# Patient Record
Sex: Female | Born: 1945 | Race: White | Hispanic: No | Smoking: Current every day smoker
Health system: Southern US, Community
[De-identification: ages and names within clinical notes are randomized; demographics above are authoritative.]

## PROBLEM LIST (undated history)

## (undated) DIAGNOSIS — M5430 Sciatica, unspecified side: Secondary | ICD-10-CM

## (undated) DIAGNOSIS — E1122 Type 2 diabetes mellitus with diabetic chronic kidney disease: Secondary | ICD-10-CM

## (undated) DIAGNOSIS — E119 Type 2 diabetes mellitus without complications: Secondary | ICD-10-CM

## (undated) DIAGNOSIS — E7800 Pure hypercholesterolemia, unspecified: Secondary | ICD-10-CM

## (undated) DIAGNOSIS — K5790 Diverticulosis of intestine, part unspecified, without perforation or abscess without bleeding: Secondary | ICD-10-CM

## (undated) DIAGNOSIS — I10 Essential (primary) hypertension: Secondary | ICD-10-CM

## (undated) DIAGNOSIS — I110 Hypertensive heart disease with heart failure: Secondary | ICD-10-CM

## (undated) DIAGNOSIS — N183 Chronic kidney disease, stage 3 unspecified: Secondary | ICD-10-CM

## (undated) HISTORY — DX: Essential (primary) hypertension: I10

## (undated) HISTORY — DX: Sciatica, unspecified side: M54.30

## (undated) HISTORY — DX: Type 2 diabetes mellitus with diabetic chronic kidney disease: E11.22

## (undated) HISTORY — PX: REPLACEMENT TOTAL KNEE: SUR1224

## (undated) HISTORY — DX: Hypertensive heart disease with heart failure: I11.0

## (undated) HISTORY — DX: Type 2 diabetes mellitus without complications: E11.9

## (undated) HISTORY — DX: Chronic kidney disease, stage 3 unspecified: N18.30

## (undated) HISTORY — DX: Diverticulosis of intestine, part unspecified, without perforation or abscess without bleeding: K57.90

## (undated) HISTORY — DX: Pure hypercholesterolemia, unspecified: E78.00

---

## 2004-02-15 ENCOUNTER — Encounter: Admission: RE | Admit: 2004-02-15 | Discharge: 2004-02-15 | Payer: Self-pay | Admitting: Orthopaedic Surgery

## 2004-02-16 ENCOUNTER — Ambulatory Visit (HOSPITAL_COMMUNITY): Admission: RE | Admit: 2004-02-16 | Discharge: 2004-02-16 | Payer: Self-pay | Admitting: Orthopaedic Surgery

## 2004-02-16 ENCOUNTER — Ambulatory Visit (HOSPITAL_BASED_OUTPATIENT_CLINIC_OR_DEPARTMENT_OTHER): Admission: RE | Admit: 2004-02-16 | Discharge: 2004-02-16 | Payer: Self-pay | Admitting: Orthopaedic Surgery

## 2004-02-23 ENCOUNTER — Ambulatory Visit (HOSPITAL_COMMUNITY): Admission: RE | Admit: 2004-02-23 | Discharge: 2004-02-23 | Payer: Self-pay | Admitting: Orthopaedic Surgery

## 2004-02-28 ENCOUNTER — Ambulatory Visit (HOSPITAL_COMMUNITY): Admission: RE | Admit: 2004-02-28 | Discharge: 2004-02-28 | Payer: Self-pay | Admitting: Orthopaedic Surgery

## 2005-07-22 ENCOUNTER — Inpatient Hospital Stay (HOSPITAL_COMMUNITY): Admission: RE | Admit: 2005-07-22 | Discharge: 2005-07-24 | Payer: Self-pay | Admitting: Orthopedic Surgery

## 2006-05-27 IMAGING — CR DG CHEST 2V
2 series · 2 of 2 positions shown · non-contrast
Comparison: 02/15/04.

CLINICAL DATA: Pre-admit for total knee arthroplasty. 
 CHEST ? 2 VIEW:

[view not recorded (1 of 2)]
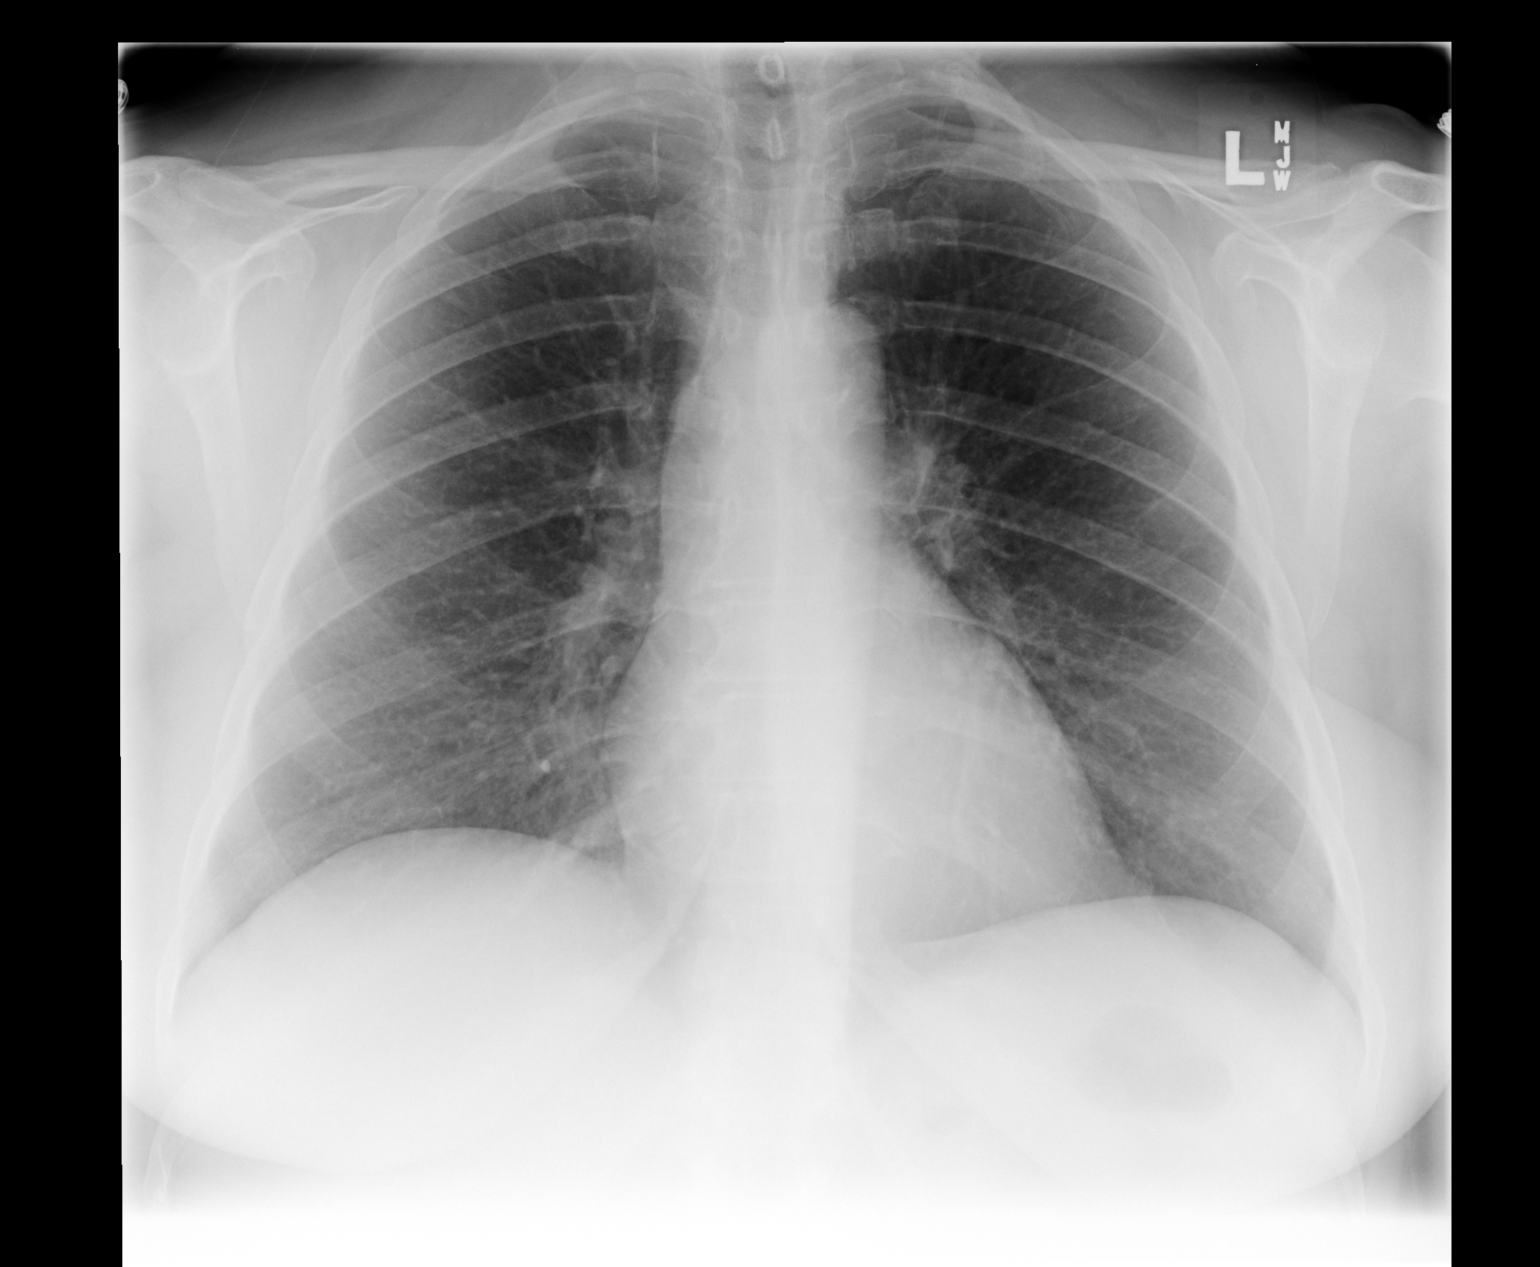

[view not recorded (2 of 2)]
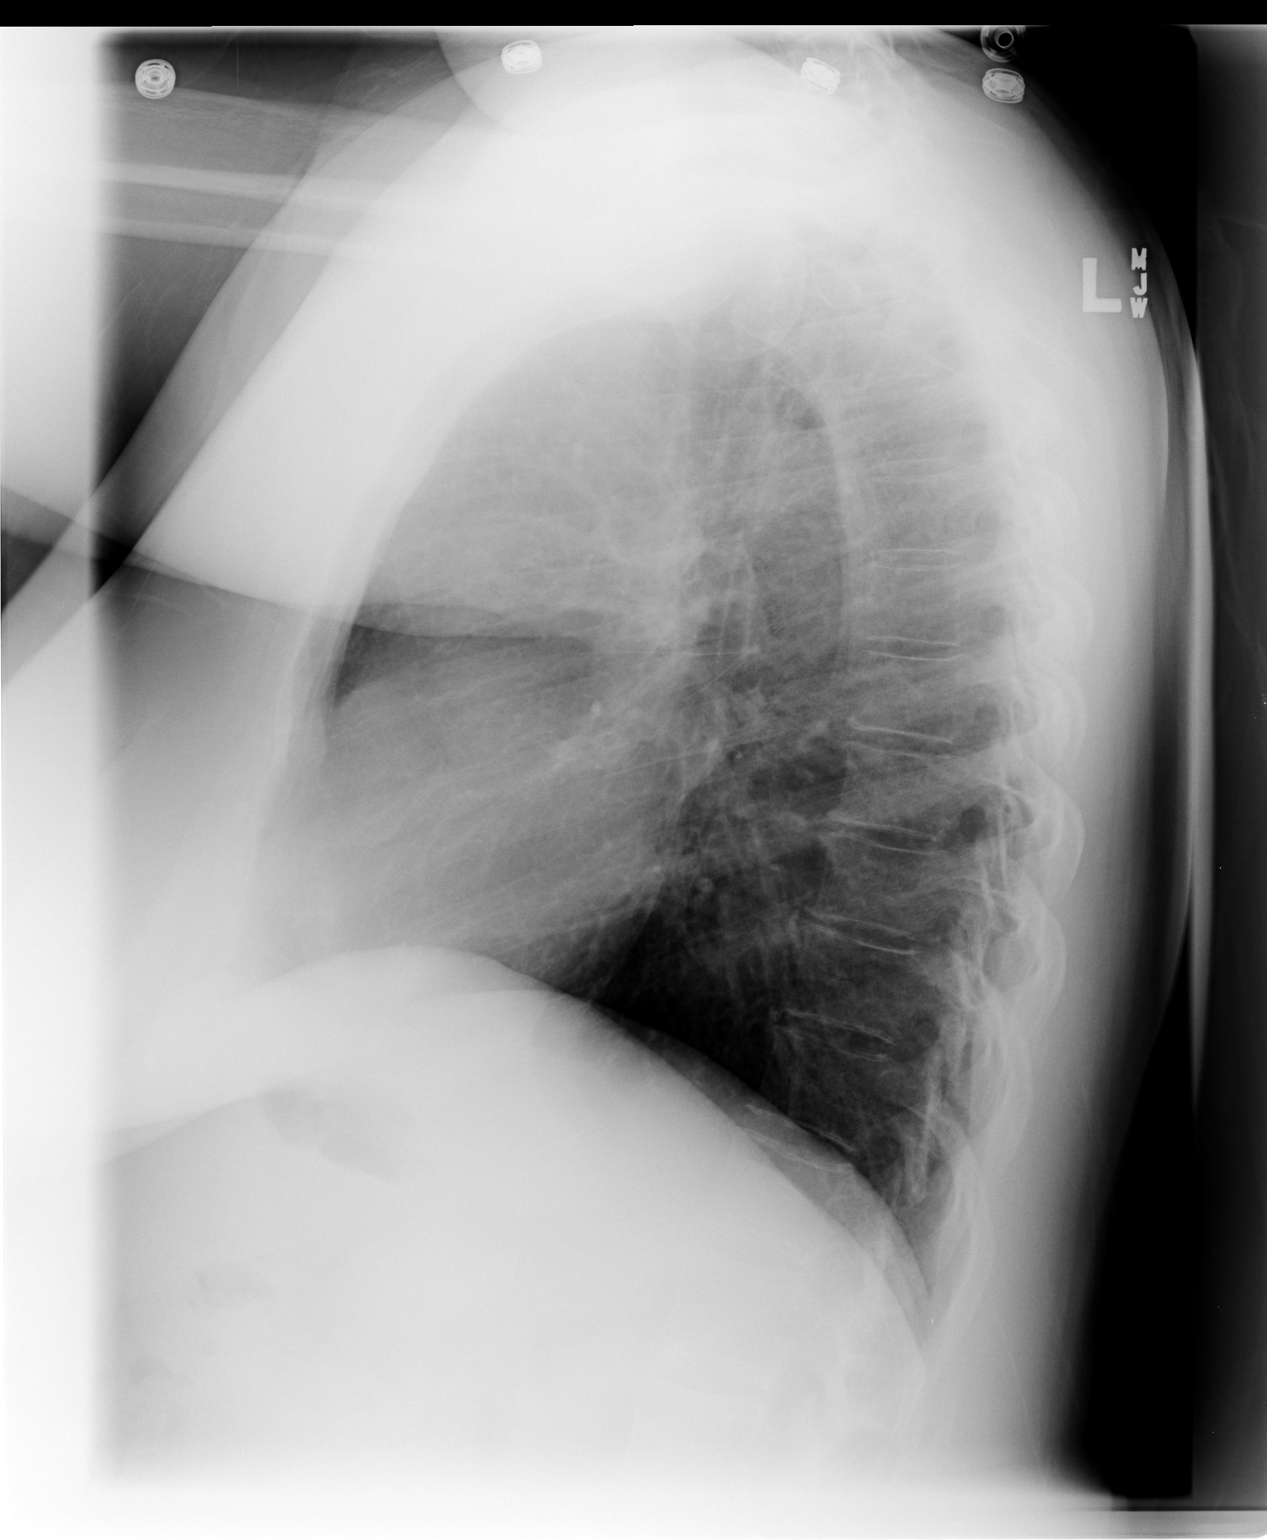

[2 of 2 positions shown; findings below may reference images not displayed]

FINDINGS: Midline trachea.  Heart size normal.  Mediastinal contours unremarkable.  Lungs clear.  No pleural effusion.  Osseous structures intact.
IMPRESSION: No acute cardiopulmonary disease.

## 2014-12-25 DIAGNOSIS — E119 Type 2 diabetes mellitus without complications: Secondary | ICD-10-CM | POA: Diagnosis not present

## 2014-12-25 DIAGNOSIS — I10 Essential (primary) hypertension: Secondary | ICD-10-CM | POA: Diagnosis not present

## 2014-12-25 DIAGNOSIS — E780 Pure hypercholesterolemia: Secondary | ICD-10-CM | POA: Diagnosis not present

## 2014-12-25 DIAGNOSIS — M545 Low back pain: Secondary | ICD-10-CM | POA: Diagnosis not present

## 2015-01-24 DIAGNOSIS — E119 Type 2 diabetes mellitus without complications: Secondary | ICD-10-CM | POA: Diagnosis not present

## 2015-01-24 DIAGNOSIS — M545 Low back pain: Secondary | ICD-10-CM | POA: Diagnosis not present

## 2015-01-24 DIAGNOSIS — F17200 Nicotine dependence, unspecified, uncomplicated: Secondary | ICD-10-CM | POA: Diagnosis not present

## 2015-01-24 DIAGNOSIS — E780 Pure hypercholesterolemia: Secondary | ICD-10-CM | POA: Diagnosis not present

## 2015-02-24 DIAGNOSIS — E119 Type 2 diabetes mellitus without complications: Secondary | ICD-10-CM | POA: Diagnosis not present

## 2015-02-24 DIAGNOSIS — F17200 Nicotine dependence, unspecified, uncomplicated: Secondary | ICD-10-CM | POA: Diagnosis not present

## 2015-02-24 DIAGNOSIS — E785 Hyperlipidemia, unspecified: Secondary | ICD-10-CM | POA: Diagnosis not present

## 2015-03-19 DIAGNOSIS — H35371 Puckering of macula, right eye: Secondary | ICD-10-CM | POA: Diagnosis not present

## 2015-03-19 DIAGNOSIS — H40013 Open angle with borderline findings, low risk, bilateral: Secondary | ICD-10-CM | POA: Diagnosis not present

## 2015-03-30 DIAGNOSIS — M539 Dorsopathy, unspecified: Secondary | ICD-10-CM | POA: Diagnosis not present

## 2015-03-30 DIAGNOSIS — Z23 Encounter for immunization: Secondary | ICD-10-CM | POA: Diagnosis not present

## 2015-04-20 DIAGNOSIS — R109 Unspecified abdominal pain: Secondary | ICD-10-CM | POA: Diagnosis not present

## 2015-04-24 DIAGNOSIS — R1901 Right upper quadrant abdominal swelling, mass and lump: Secondary | ICD-10-CM | POA: Diagnosis not present

## 2015-04-24 DIAGNOSIS — R109 Unspecified abdominal pain: Secondary | ICD-10-CM | POA: Diagnosis not present

## 2015-04-24 DIAGNOSIS — R1011 Right upper quadrant pain: Secondary | ICD-10-CM | POA: Diagnosis not present

## 2015-04-26 DIAGNOSIS — B0223 Postherpetic polyneuropathy: Secondary | ICD-10-CM | POA: Diagnosis not present

## 2015-04-30 DIAGNOSIS — K7689 Other specified diseases of liver: Secondary | ICD-10-CM | POA: Diagnosis not present

## 2015-04-30 DIAGNOSIS — K769 Liver disease, unspecified: Secondary | ICD-10-CM | POA: Diagnosis not present

## 2015-04-30 DIAGNOSIS — R1011 Right upper quadrant pain: Secondary | ICD-10-CM | POA: Diagnosis not present

## 2015-05-03 DIAGNOSIS — Z79899 Other long term (current) drug therapy: Secondary | ICD-10-CM | POA: Diagnosis not present

## 2015-05-21 DIAGNOSIS — E782 Mixed hyperlipidemia: Secondary | ICD-10-CM | POA: Diagnosis not present

## 2015-05-21 DIAGNOSIS — E1165 Type 2 diabetes mellitus with hyperglycemia: Secondary | ICD-10-CM | POA: Diagnosis not present

## 2015-05-22 DIAGNOSIS — E669 Obesity, unspecified: Secondary | ICD-10-CM | POA: Diagnosis not present

## 2015-05-22 DIAGNOSIS — E279 Disorder of adrenal gland, unspecified: Secondary | ICD-10-CM | POA: Diagnosis not present

## 2015-05-22 DIAGNOSIS — Z6830 Body mass index (BMI) 30.0-30.9, adult: Secondary | ICD-10-CM | POA: Diagnosis not present

## 2015-05-28 DIAGNOSIS — E279 Disorder of adrenal gland, unspecified: Secondary | ICD-10-CM | POA: Diagnosis not present

## 2015-05-30 DIAGNOSIS — D1779 Benign lipomatous neoplasm of other sites: Secondary | ICD-10-CM | POA: Diagnosis not present

## 2015-05-30 DIAGNOSIS — E1165 Type 2 diabetes mellitus with hyperglycemia: Secondary | ICD-10-CM | POA: Diagnosis not present

## 2015-05-31 DIAGNOSIS — E279 Disorder of adrenal gland, unspecified: Secondary | ICD-10-CM | POA: Diagnosis not present

## 2015-08-10 DIAGNOSIS — D1779 Benign lipomatous neoplasm of other sites: Secondary | ICD-10-CM | POA: Diagnosis not present

## 2015-08-10 DIAGNOSIS — Z0000 Encounter for general adult medical examination without abnormal findings: Secondary | ICD-10-CM | POA: Diagnosis not present

## 2015-08-10 DIAGNOSIS — E1165 Type 2 diabetes mellitus with hyperglycemia: Secondary | ICD-10-CM | POA: Diagnosis not present

## 2015-08-10 DIAGNOSIS — N8111 Cystocele, midline: Secondary | ICD-10-CM | POA: Diagnosis not present

## 2015-08-16 DIAGNOSIS — Z1211 Encounter for screening for malignant neoplasm of colon: Secondary | ICD-10-CM | POA: Diagnosis not present

## 2015-08-31 DIAGNOSIS — Z1231 Encounter for screening mammogram for malignant neoplasm of breast: Secondary | ICD-10-CM | POA: Diagnosis not present

## 2015-09-10 DIAGNOSIS — E278 Other specified disorders of adrenal gland: Secondary | ICD-10-CM | POA: Insufficient documentation

## 2015-09-10 DIAGNOSIS — E2740 Unspecified adrenocortical insufficiency: Secondary | ICD-10-CM | POA: Diagnosis not present

## 2015-09-10 DIAGNOSIS — E785 Hyperlipidemia, unspecified: Secondary | ICD-10-CM | POA: Diagnosis not present

## 2015-09-10 DIAGNOSIS — E109 Type 1 diabetes mellitus without complications: Secondary | ICD-10-CM | POA: Diagnosis not present

## 2015-09-10 DIAGNOSIS — E279 Disorder of adrenal gland, unspecified: Secondary | ICD-10-CM | POA: Diagnosis not present

## 2015-09-10 DIAGNOSIS — Z7982 Long term (current) use of aspirin: Secondary | ICD-10-CM | POA: Diagnosis not present

## 2015-09-10 DIAGNOSIS — Z79899 Other long term (current) drug therapy: Secondary | ICD-10-CM | POA: Diagnosis not present

## 2015-09-10 DIAGNOSIS — I10 Essential (primary) hypertension: Secondary | ICD-10-CM | POA: Diagnosis not present

## 2015-09-10 DIAGNOSIS — Z7984 Long term (current) use of oral hypoglycemic drugs: Secondary | ICD-10-CM | POA: Diagnosis not present

## 2015-09-10 HISTORY — DX: Other specified disorders of adrenal gland: E27.8

## 2015-09-12 DIAGNOSIS — E279 Disorder of adrenal gland, unspecified: Secondary | ICD-10-CM | POA: Diagnosis not present

## 2015-09-17 DIAGNOSIS — R7989 Other specified abnormal findings of blood chemistry: Secondary | ICD-10-CM | POA: Insufficient documentation

## 2015-09-17 HISTORY — DX: Other specified abnormal findings of blood chemistry: R79.89

## 2015-09-27 DIAGNOSIS — Z79899 Other long term (current) drug therapy: Secondary | ICD-10-CM | POA: Diagnosis not present

## 2015-09-27 DIAGNOSIS — Z7982 Long term (current) use of aspirin: Secondary | ICD-10-CM | POA: Diagnosis not present

## 2015-09-27 DIAGNOSIS — E279 Disorder of adrenal gland, unspecified: Secondary | ICD-10-CM | POA: Diagnosis not present

## 2015-09-27 DIAGNOSIS — Z7984 Long term (current) use of oral hypoglycemic drugs: Secondary | ICD-10-CM | POA: Diagnosis not present

## 2015-09-27 DIAGNOSIS — E109 Type 1 diabetes mellitus without complications: Secondary | ICD-10-CM | POA: Diagnosis not present

## 2015-09-27 DIAGNOSIS — E785 Hyperlipidemia, unspecified: Secondary | ICD-10-CM | POA: Diagnosis not present

## 2015-09-27 DIAGNOSIS — I10 Essential (primary) hypertension: Secondary | ICD-10-CM | POA: Diagnosis not present

## 2015-10-24 DIAGNOSIS — I10 Essential (primary) hypertension: Secondary | ICD-10-CM | POA: Diagnosis not present

## 2015-10-24 DIAGNOSIS — R9431 Abnormal electrocardiogram [ECG] [EKG]: Secondary | ICD-10-CM | POA: Diagnosis not present

## 2015-10-24 DIAGNOSIS — Z01810 Encounter for preprocedural cardiovascular examination: Secondary | ICD-10-CM | POA: Diagnosis not present

## 2015-10-24 DIAGNOSIS — Z01812 Encounter for preprocedural laboratory examination: Secondary | ICD-10-CM | POA: Diagnosis not present

## 2015-10-24 DIAGNOSIS — E279 Disorder of adrenal gland, unspecified: Secondary | ICD-10-CM | POA: Diagnosis not present

## 2015-10-26 DIAGNOSIS — I10 Essential (primary) hypertension: Secondary | ICD-10-CM | POA: Diagnosis not present

## 2015-10-31 DIAGNOSIS — Z7982 Long term (current) use of aspirin: Secondary | ICD-10-CM | POA: Diagnosis not present

## 2015-10-31 DIAGNOSIS — Z79899 Other long term (current) drug therapy: Secondary | ICD-10-CM | POA: Diagnosis not present

## 2015-10-31 DIAGNOSIS — E279 Disorder of adrenal gland, unspecified: Secondary | ICD-10-CM | POA: Diagnosis not present

## 2015-10-31 DIAGNOSIS — Z7951 Long term (current) use of inhaled steroids: Secondary | ICD-10-CM | POA: Diagnosis not present

## 2015-10-31 DIAGNOSIS — R42 Dizziness and giddiness: Secondary | ICD-10-CM | POA: Diagnosis not present

## 2015-10-31 DIAGNOSIS — Z7984 Long term (current) use of oral hypoglycemic drugs: Secondary | ICD-10-CM | POA: Diagnosis not present

## 2015-10-31 DIAGNOSIS — E119 Type 2 diabetes mellitus without complications: Secondary | ICD-10-CM | POA: Diagnosis not present

## 2015-10-31 DIAGNOSIS — E785 Hyperlipidemia, unspecified: Secondary | ICD-10-CM | POA: Diagnosis not present

## 2015-10-31 DIAGNOSIS — I10 Essential (primary) hypertension: Secondary | ICD-10-CM | POA: Diagnosis not present

## 2015-10-31 DIAGNOSIS — J302 Other seasonal allergic rhinitis: Secondary | ICD-10-CM | POA: Diagnosis not present

## 2015-10-31 DIAGNOSIS — D3500 Benign neoplasm of unspecified adrenal gland: Secondary | ICD-10-CM | POA: Diagnosis not present

## 2015-10-31 DIAGNOSIS — E278 Other specified disorders of adrenal gland: Secondary | ICD-10-CM | POA: Diagnosis not present

## 2015-10-31 DIAGNOSIS — J449 Chronic obstructive pulmonary disease, unspecified: Secondary | ICD-10-CM | POA: Diagnosis not present

## 2015-10-31 DIAGNOSIS — G4733 Obstructive sleep apnea (adult) (pediatric): Secondary | ICD-10-CM | POA: Diagnosis not present

## 2015-10-31 DIAGNOSIS — D1779 Benign lipomatous neoplasm of other sites: Secondary | ICD-10-CM | POA: Diagnosis not present

## 2015-11-22 DIAGNOSIS — E896 Postprocedural adrenocortical (-medullary) hypofunction: Secondary | ICD-10-CM | POA: Diagnosis not present

## 2015-11-22 DIAGNOSIS — Z483 Aftercare following surgery for neoplasm: Secondary | ICD-10-CM | POA: Diagnosis not present

## 2015-11-22 DIAGNOSIS — D1779 Benign lipomatous neoplasm of other sites: Secondary | ICD-10-CM | POA: Diagnosis not present

## 2015-12-11 DIAGNOSIS — E1165 Type 2 diabetes mellitus with hyperglycemia: Secondary | ICD-10-CM | POA: Diagnosis not present

## 2015-12-20 DIAGNOSIS — Z720 Tobacco use: Secondary | ICD-10-CM | POA: Diagnosis not present

## 2015-12-20 DIAGNOSIS — J449 Chronic obstructive pulmonary disease, unspecified: Secondary | ICD-10-CM | POA: Diagnosis not present

## 2015-12-20 DIAGNOSIS — E1165 Type 2 diabetes mellitus with hyperglycemia: Secondary | ICD-10-CM | POA: Diagnosis not present

## 2015-12-20 DIAGNOSIS — Z0000 Encounter for general adult medical examination without abnormal findings: Secondary | ICD-10-CM | POA: Diagnosis not present

## 2016-03-27 DIAGNOSIS — Z23 Encounter for immunization: Secondary | ICD-10-CM | POA: Diagnosis not present

## 2016-04-23 DIAGNOSIS — R05 Cough: Secondary | ICD-10-CM | POA: Diagnosis not present

## 2016-04-23 DIAGNOSIS — J189 Pneumonia, unspecified organism: Secondary | ICD-10-CM | POA: Diagnosis not present

## 2016-04-23 DIAGNOSIS — R062 Wheezing: Secondary | ICD-10-CM | POA: Diagnosis not present

## 2016-05-24 DIAGNOSIS — J189 Pneumonia, unspecified organism: Secondary | ICD-10-CM | POA: Diagnosis not present

## 2016-06-13 DIAGNOSIS — E1165 Type 2 diabetes mellitus with hyperglycemia: Secondary | ICD-10-CM | POA: Diagnosis not present

## 2016-06-20 DIAGNOSIS — Z9181 History of falling: Secondary | ICD-10-CM | POA: Diagnosis not present

## 2016-06-20 DIAGNOSIS — Z1389 Encounter for screening for other disorder: Secondary | ICD-10-CM | POA: Diagnosis not present

## 2016-06-20 DIAGNOSIS — E1165 Type 2 diabetes mellitus with hyperglycemia: Secondary | ICD-10-CM | POA: Diagnosis not present

## 2016-06-20 DIAGNOSIS — J42 Unspecified chronic bronchitis: Secondary | ICD-10-CM | POA: Diagnosis not present

## 2016-06-23 DIAGNOSIS — J189 Pneumonia, unspecified organism: Secondary | ICD-10-CM | POA: Diagnosis not present

## 2016-07-24 DIAGNOSIS — L089 Local infection of the skin and subcutaneous tissue, unspecified: Secondary | ICD-10-CM | POA: Diagnosis not present

## 2016-07-24 DIAGNOSIS — L723 Sebaceous cyst: Secondary | ICD-10-CM | POA: Diagnosis not present

## 2016-07-24 DIAGNOSIS — J189 Pneumonia, unspecified organism: Secondary | ICD-10-CM | POA: Diagnosis not present

## 2016-07-25 DIAGNOSIS — L723 Sebaceous cyst: Secondary | ICD-10-CM | POA: Diagnosis not present

## 2016-08-18 DIAGNOSIS — L723 Sebaceous cyst: Secondary | ICD-10-CM | POA: Diagnosis not present

## 2016-08-18 DIAGNOSIS — L089 Local infection of the skin and subcutaneous tissue, unspecified: Secondary | ICD-10-CM | POA: Diagnosis not present

## 2016-08-24 DIAGNOSIS — J189 Pneumonia, unspecified organism: Secondary | ICD-10-CM | POA: Diagnosis not present

## 2016-09-21 DIAGNOSIS — J189 Pneumonia, unspecified organism: Secondary | ICD-10-CM | POA: Diagnosis not present

## 2016-10-21 DIAGNOSIS — Z1231 Encounter for screening mammogram for malignant neoplasm of breast: Secondary | ICD-10-CM | POA: Diagnosis not present

## 2016-10-22 DIAGNOSIS — J189 Pneumonia, unspecified organism: Secondary | ICD-10-CM | POA: Diagnosis not present

## 2016-11-20 DIAGNOSIS — J189 Pneumonia, unspecified organism: Secondary | ICD-10-CM | POA: Diagnosis not present

## 2016-11-21 DIAGNOSIS — J189 Pneumonia, unspecified organism: Secondary | ICD-10-CM | POA: Diagnosis not present

## 2016-12-08 DIAGNOSIS — E1165 Type 2 diabetes mellitus with hyperglycemia: Secondary | ICD-10-CM | POA: Diagnosis not present

## 2016-12-22 DIAGNOSIS — J189 Pneumonia, unspecified organism: Secondary | ICD-10-CM | POA: Diagnosis not present

## 2016-12-24 DIAGNOSIS — R7989 Other specified abnormal findings of blood chemistry: Secondary | ICD-10-CM | POA: Diagnosis not present

## 2016-12-24 DIAGNOSIS — Z0000 Encounter for general adult medical examination without abnormal findings: Secondary | ICD-10-CM | POA: Diagnosis not present

## 2016-12-24 DIAGNOSIS — E1165 Type 2 diabetes mellitus with hyperglycemia: Secondary | ICD-10-CM | POA: Diagnosis not present

## 2017-01-21 DIAGNOSIS — J189 Pneumonia, unspecified organism: Secondary | ICD-10-CM | POA: Diagnosis not present

## 2017-02-21 DIAGNOSIS — J189 Pneumonia, unspecified organism: Secondary | ICD-10-CM | POA: Diagnosis not present

## 2017-02-23 DIAGNOSIS — E1165 Type 2 diabetes mellitus with hyperglycemia: Secondary | ICD-10-CM | POA: Diagnosis not present

## 2017-03-04 DIAGNOSIS — Z23 Encounter for immunization: Secondary | ICD-10-CM | POA: Diagnosis not present

## 2017-03-24 DIAGNOSIS — J189 Pneumonia, unspecified organism: Secondary | ICD-10-CM | POA: Diagnosis not present

## 2017-04-07 DIAGNOSIS — H40013 Open angle with borderline findings, low risk, bilateral: Secondary | ICD-10-CM | POA: Diagnosis not present

## 2017-04-07 DIAGNOSIS — E119 Type 2 diabetes mellitus without complications: Secondary | ICD-10-CM | POA: Diagnosis not present

## 2017-04-23 DIAGNOSIS — J189 Pneumonia, unspecified organism: Secondary | ICD-10-CM | POA: Diagnosis not present

## 2017-06-17 DIAGNOSIS — E1165 Type 2 diabetes mellitus with hyperglycemia: Secondary | ICD-10-CM | POA: Diagnosis not present

## 2017-06-25 DIAGNOSIS — Z1331 Encounter for screening for depression: Secondary | ICD-10-CM | POA: Diagnosis not present

## 2017-06-25 DIAGNOSIS — E1165 Type 2 diabetes mellitus with hyperglycemia: Secondary | ICD-10-CM | POA: Diagnosis not present

## 2017-06-25 DIAGNOSIS — Z9181 History of falling: Secondary | ICD-10-CM | POA: Diagnosis not present

## 2017-08-04 DIAGNOSIS — I10 Essential (primary) hypertension: Secondary | ICD-10-CM | POA: Diagnosis not present

## 2017-08-04 DIAGNOSIS — H47233 Glaucomatous optic atrophy, bilateral: Secondary | ICD-10-CM | POA: Diagnosis not present

## 2017-08-04 DIAGNOSIS — H401131 Primary open-angle glaucoma, bilateral, mild stage: Secondary | ICD-10-CM | POA: Diagnosis not present

## 2017-09-02 DIAGNOSIS — H401131 Primary open-angle glaucoma, bilateral, mild stage: Secondary | ICD-10-CM | POA: Diagnosis not present

## 2017-09-02 DIAGNOSIS — H47233 Glaucomatous optic atrophy, bilateral: Secondary | ICD-10-CM | POA: Diagnosis not present

## 2017-12-22 DIAGNOSIS — E1165 Type 2 diabetes mellitus with hyperglycemia: Secondary | ICD-10-CM | POA: Diagnosis not present

## 2017-12-22 DIAGNOSIS — Z1231 Encounter for screening mammogram for malignant neoplasm of breast: Secondary | ICD-10-CM | POA: Diagnosis not present

## 2017-12-22 DIAGNOSIS — Z0000 Encounter for general adult medical examination without abnormal findings: Secondary | ICD-10-CM | POA: Diagnosis not present

## 2017-12-28 DIAGNOSIS — H409 Unspecified glaucoma: Secondary | ICD-10-CM | POA: Diagnosis not present

## 2017-12-28 DIAGNOSIS — Z0000 Encounter for general adult medical examination without abnormal findings: Secondary | ICD-10-CM | POA: Diagnosis not present

## 2017-12-28 DIAGNOSIS — R209 Unspecified disturbances of skin sensation: Secondary | ICD-10-CM | POA: Diagnosis not present

## 2017-12-28 DIAGNOSIS — E1165 Type 2 diabetes mellitus with hyperglycemia: Secondary | ICD-10-CM | POA: Diagnosis not present

## 2017-12-28 DIAGNOSIS — Z1339 Encounter for screening examination for other mental health and behavioral disorders: Secondary | ICD-10-CM | POA: Diagnosis not present

## 2018-01-14 DIAGNOSIS — H401131 Primary open-angle glaucoma, bilateral, mild stage: Secondary | ICD-10-CM | POA: Diagnosis not present

## 2018-01-14 DIAGNOSIS — H47233 Glaucomatous optic atrophy, bilateral: Secondary | ICD-10-CM | POA: Diagnosis not present

## 2018-04-14 DIAGNOSIS — Z6828 Body mass index (BMI) 28.0-28.9, adult: Secondary | ICD-10-CM | POA: Diagnosis not present

## 2018-04-14 DIAGNOSIS — R1110 Vomiting, unspecified: Secondary | ICD-10-CM | POA: Diagnosis not present

## 2018-04-14 DIAGNOSIS — J309 Allergic rhinitis, unspecified: Secondary | ICD-10-CM | POA: Diagnosis not present

## 2018-04-14 DIAGNOSIS — J0100 Acute maxillary sinusitis, unspecified: Secondary | ICD-10-CM | POA: Diagnosis not present

## 2018-05-10 DIAGNOSIS — Z23 Encounter for immunization: Secondary | ICD-10-CM | POA: Diagnosis not present

## 2018-05-25 DIAGNOSIS — E1165 Type 2 diabetes mellitus with hyperglycemia: Secondary | ICD-10-CM | POA: Diagnosis not present

## 2018-05-31 DIAGNOSIS — Z6828 Body mass index (BMI) 28.0-28.9, adult: Secondary | ICD-10-CM | POA: Diagnosis not present

## 2018-05-31 DIAGNOSIS — I10 Essential (primary) hypertension: Secondary | ICD-10-CM | POA: Diagnosis not present

## 2018-05-31 DIAGNOSIS — E1165 Type 2 diabetes mellitus with hyperglycemia: Secondary | ICD-10-CM | POA: Diagnosis not present

## 2018-07-08 DIAGNOSIS — R0982 Postnasal drip: Secondary | ICD-10-CM | POA: Diagnosis not present

## 2018-07-08 DIAGNOSIS — J449 Chronic obstructive pulmonary disease, unspecified: Secondary | ICD-10-CM | POA: Diagnosis not present

## 2018-07-08 DIAGNOSIS — Z0000 Encounter for general adult medical examination without abnormal findings: Secondary | ICD-10-CM | POA: Diagnosis not present

## 2018-07-08 DIAGNOSIS — I10 Essential (primary) hypertension: Secondary | ICD-10-CM | POA: Diagnosis not present

## 2018-07-08 DIAGNOSIS — E782 Mixed hyperlipidemia: Secondary | ICD-10-CM | POA: Diagnosis not present

## 2018-07-08 DIAGNOSIS — Z720 Tobacco use: Secondary | ICD-10-CM | POA: Diagnosis not present

## 2018-07-08 DIAGNOSIS — E1165 Type 2 diabetes mellitus with hyperglycemia: Secondary | ICD-10-CM | POA: Diagnosis not present

## 2018-07-08 DIAGNOSIS — J309 Allergic rhinitis, unspecified: Secondary | ICD-10-CM | POA: Diagnosis not present

## 2018-07-08 DIAGNOSIS — H409 Unspecified glaucoma: Secondary | ICD-10-CM | POA: Diagnosis not present

## 2018-07-08 DIAGNOSIS — Z1331 Encounter for screening for depression: Secondary | ICD-10-CM | POA: Diagnosis not present

## 2018-08-09 DIAGNOSIS — M1811 Unilateral primary osteoarthritis of first carpometacarpal joint, right hand: Secondary | ICD-10-CM | POA: Diagnosis not present

## 2018-08-09 DIAGNOSIS — S63641S Sprain of metacarpophalangeal joint of right thumb, sequela: Secondary | ICD-10-CM | POA: Diagnosis not present

## 2018-08-09 DIAGNOSIS — Z6829 Body mass index (BMI) 29.0-29.9, adult: Secondary | ICD-10-CM | POA: Diagnosis not present

## 2018-08-16 DIAGNOSIS — M1811 Unilateral primary osteoarthritis of first carpometacarpal joint, right hand: Secondary | ICD-10-CM | POA: Diagnosis not present

## 2018-08-16 DIAGNOSIS — R52 Pain, unspecified: Secondary | ICD-10-CM | POA: Diagnosis not present

## 2018-08-16 DIAGNOSIS — G5601 Carpal tunnel syndrome, right upper limb: Secondary | ICD-10-CM | POA: Diagnosis not present

## 2018-08-20 DIAGNOSIS — M1811 Unilateral primary osteoarthritis of first carpometacarpal joint, right hand: Secondary | ICD-10-CM | POA: Diagnosis not present

## 2018-08-20 DIAGNOSIS — M25641 Stiffness of right hand, not elsewhere classified: Secondary | ICD-10-CM | POA: Diagnosis not present

## 2018-08-20 DIAGNOSIS — M79644 Pain in right finger(s): Secondary | ICD-10-CM | POA: Diagnosis not present

## 2018-10-26 DIAGNOSIS — I10 Essential (primary) hypertension: Secondary | ICD-10-CM | POA: Diagnosis not present

## 2018-10-26 DIAGNOSIS — E1165 Type 2 diabetes mellitus with hyperglycemia: Secondary | ICD-10-CM | POA: Diagnosis not present

## 2018-10-26 DIAGNOSIS — Z6829 Body mass index (BMI) 29.0-29.9, adult: Secondary | ICD-10-CM | POA: Diagnosis not present

## 2018-10-26 DIAGNOSIS — Z79899 Other long term (current) drug therapy: Secondary | ICD-10-CM | POA: Diagnosis not present

## 2018-10-26 DIAGNOSIS — E782 Mixed hyperlipidemia: Secondary | ICD-10-CM | POA: Diagnosis not present

## 2018-10-28 DIAGNOSIS — E1165 Type 2 diabetes mellitus with hyperglycemia: Secondary | ICD-10-CM | POA: Diagnosis not present

## 2018-10-28 DIAGNOSIS — E782 Mixed hyperlipidemia: Secondary | ICD-10-CM | POA: Diagnosis not present

## 2018-11-27 DIAGNOSIS — E1165 Type 2 diabetes mellitus with hyperglycemia: Secondary | ICD-10-CM | POA: Diagnosis not present

## 2018-11-27 DIAGNOSIS — I10 Essential (primary) hypertension: Secondary | ICD-10-CM | POA: Diagnosis not present

## 2018-12-01 DIAGNOSIS — E1165 Type 2 diabetes mellitus with hyperglycemia: Secondary | ICD-10-CM | POA: Diagnosis not present

## 2018-12-01 DIAGNOSIS — I10 Essential (primary) hypertension: Secondary | ICD-10-CM | POA: Diagnosis not present

## 2018-12-01 DIAGNOSIS — R0982 Postnasal drip: Secondary | ICD-10-CM | POA: Diagnosis not present

## 2018-12-01 DIAGNOSIS — J309 Allergic rhinitis, unspecified: Secondary | ICD-10-CM | POA: Diagnosis not present

## 2018-12-01 DIAGNOSIS — Z6829 Body mass index (BMI) 29.0-29.9, adult: Secondary | ICD-10-CM | POA: Diagnosis not present

## 2019-03-08 DIAGNOSIS — E1165 Type 2 diabetes mellitus with hyperglycemia: Secondary | ICD-10-CM | POA: Diagnosis not present

## 2019-03-08 DIAGNOSIS — Z6828 Body mass index (BMI) 28.0-28.9, adult: Secondary | ICD-10-CM | POA: Diagnosis not present

## 2019-03-08 DIAGNOSIS — I10 Essential (primary) hypertension: Secondary | ICD-10-CM | POA: Diagnosis not present

## 2019-03-08 DIAGNOSIS — R0600 Dyspnea, unspecified: Secondary | ICD-10-CM | POA: Diagnosis not present

## 2019-03-08 DIAGNOSIS — Z23 Encounter for immunization: Secondary | ICD-10-CM | POA: Diagnosis not present

## 2019-03-18 DIAGNOSIS — Z1231 Encounter for screening mammogram for malignant neoplasm of breast: Secondary | ICD-10-CM | POA: Diagnosis not present

## 2019-04-07 DIAGNOSIS — I10 Essential (primary) hypertension: Secondary | ICD-10-CM | POA: Diagnosis not present

## 2019-04-07 DIAGNOSIS — E1165 Type 2 diabetes mellitus with hyperglycemia: Secondary | ICD-10-CM | POA: Diagnosis not present

## 2019-04-07 DIAGNOSIS — R0600 Dyspnea, unspecified: Secondary | ICD-10-CM | POA: Diagnosis not present

## 2019-04-07 DIAGNOSIS — E669 Obesity, unspecified: Secondary | ICD-10-CM | POA: Diagnosis not present

## 2019-04-07 DIAGNOSIS — Z6829 Body mass index (BMI) 29.0-29.9, adult: Secondary | ICD-10-CM | POA: Diagnosis not present

## 2019-04-25 DIAGNOSIS — R69 Illness, unspecified: Secondary | ICD-10-CM | POA: Diagnosis not present

## 2019-04-29 DIAGNOSIS — I10 Essential (primary) hypertension: Secondary | ICD-10-CM | POA: Diagnosis not present

## 2019-04-29 DIAGNOSIS — E1165 Type 2 diabetes mellitus with hyperglycemia: Secondary | ICD-10-CM | POA: Diagnosis not present

## 2019-05-30 DIAGNOSIS — E7800 Pure hypercholesterolemia, unspecified: Secondary | ICD-10-CM | POA: Diagnosis not present

## 2019-05-30 DIAGNOSIS — I10 Essential (primary) hypertension: Secondary | ICD-10-CM | POA: Diagnosis not present

## 2019-05-30 DIAGNOSIS — E1165 Type 2 diabetes mellitus with hyperglycemia: Secondary | ICD-10-CM | POA: Diagnosis not present

## 2019-06-12 DIAGNOSIS — R69 Illness, unspecified: Secondary | ICD-10-CM | POA: Diagnosis not present

## 2019-06-15 DIAGNOSIS — R69 Illness, unspecified: Secondary | ICD-10-CM | POA: Diagnosis not present

## 2019-07-11 DIAGNOSIS — E782 Mixed hyperlipidemia: Secondary | ICD-10-CM | POA: Diagnosis not present

## 2019-07-11 DIAGNOSIS — H409 Unspecified glaucoma: Secondary | ICD-10-CM | POA: Diagnosis not present

## 2019-07-11 DIAGNOSIS — J449 Chronic obstructive pulmonary disease, unspecified: Secondary | ICD-10-CM | POA: Diagnosis not present

## 2019-07-11 DIAGNOSIS — Z79899 Other long term (current) drug therapy: Secondary | ICD-10-CM | POA: Diagnosis not present

## 2019-07-11 DIAGNOSIS — K219 Gastro-esophageal reflux disease without esophagitis: Secondary | ICD-10-CM | POA: Diagnosis not present

## 2019-07-11 DIAGNOSIS — Z0000 Encounter for general adult medical examination without abnormal findings: Secondary | ICD-10-CM | POA: Diagnosis not present

## 2019-07-11 DIAGNOSIS — J309 Allergic rhinitis, unspecified: Secondary | ICD-10-CM | POA: Diagnosis not present

## 2019-07-11 DIAGNOSIS — I10 Essential (primary) hypertension: Secondary | ICD-10-CM | POA: Diagnosis not present

## 2019-07-11 DIAGNOSIS — R0982 Postnasal drip: Secondary | ICD-10-CM | POA: Diagnosis not present

## 2019-07-11 DIAGNOSIS — E1165 Type 2 diabetes mellitus with hyperglycemia: Secondary | ICD-10-CM | POA: Diagnosis not present

## 2019-07-19 DIAGNOSIS — Z7984 Long term (current) use of oral hypoglycemic drugs: Secondary | ICD-10-CM | POA: Diagnosis not present

## 2019-07-19 DIAGNOSIS — H5203 Hypermetropia, bilateral: Secondary | ICD-10-CM | POA: Diagnosis not present

## 2019-07-19 DIAGNOSIS — H401131 Primary open-angle glaucoma, bilateral, mild stage: Secondary | ICD-10-CM | POA: Diagnosis not present

## 2019-07-19 DIAGNOSIS — H3530 Unspecified macular degeneration: Secondary | ICD-10-CM | POA: Diagnosis not present

## 2019-07-19 DIAGNOSIS — H25813 Combined forms of age-related cataract, bilateral: Secondary | ICD-10-CM | POA: Diagnosis not present

## 2019-07-19 DIAGNOSIS — H353121 Nonexudative age-related macular degeneration, left eye, early dry stage: Secondary | ICD-10-CM | POA: Diagnosis not present

## 2019-07-19 DIAGNOSIS — H353112 Nonexudative age-related macular degeneration, right eye, intermediate dry stage: Secondary | ICD-10-CM | POA: Diagnosis not present

## 2019-07-19 DIAGNOSIS — H47233 Glaucomatous optic atrophy, bilateral: Secondary | ICD-10-CM | POA: Diagnosis not present

## 2019-07-19 DIAGNOSIS — E119 Type 2 diabetes mellitus without complications: Secondary | ICD-10-CM | POA: Diagnosis not present

## 2019-07-19 DIAGNOSIS — H52223 Regular astigmatism, bilateral: Secondary | ICD-10-CM | POA: Diagnosis not present

## 2019-07-22 DIAGNOSIS — Z0100 Encounter for examination of eyes and vision without abnormal findings: Secondary | ICD-10-CM | POA: Diagnosis not present

## 2019-07-30 DIAGNOSIS — I10 Essential (primary) hypertension: Secondary | ICD-10-CM | POA: Diagnosis not present

## 2019-07-30 DIAGNOSIS — E1165 Type 2 diabetes mellitus with hyperglycemia: Secondary | ICD-10-CM | POA: Diagnosis not present

## 2019-07-30 DIAGNOSIS — J449 Chronic obstructive pulmonary disease, unspecified: Secondary | ICD-10-CM | POA: Diagnosis not present

## 2019-08-11 DIAGNOSIS — Z6828 Body mass index (BMI) 28.0-28.9, adult: Secondary | ICD-10-CM | POA: Diagnosis not present

## 2019-08-11 DIAGNOSIS — Z9181 History of falling: Secondary | ICD-10-CM | POA: Diagnosis not present

## 2019-08-11 DIAGNOSIS — E119 Type 2 diabetes mellitus without complications: Secondary | ICD-10-CM | POA: Diagnosis not present

## 2019-08-11 DIAGNOSIS — Z1331 Encounter for screening for depression: Secondary | ICD-10-CM | POA: Diagnosis not present

## 2019-08-11 DIAGNOSIS — K219 Gastro-esophageal reflux disease without esophagitis: Secondary | ICD-10-CM | POA: Diagnosis not present

## 2019-08-11 DIAGNOSIS — I10 Essential (primary) hypertension: Secondary | ICD-10-CM | POA: Diagnosis not present

## 2019-09-08 DIAGNOSIS — H25813 Combined forms of age-related cataract, bilateral: Secondary | ICD-10-CM | POA: Diagnosis not present

## 2019-09-08 DIAGNOSIS — H353132 Nonexudative age-related macular degeneration, bilateral, intermediate dry stage: Secondary | ICD-10-CM | POA: Diagnosis not present

## 2019-09-15 DIAGNOSIS — M5386 Other specified dorsopathies, lumbar region: Secondary | ICD-10-CM | POA: Diagnosis not present

## 2019-09-23 DIAGNOSIS — Z6827 Body mass index (BMI) 27.0-27.9, adult: Secondary | ICD-10-CM | POA: Diagnosis not present

## 2019-09-23 DIAGNOSIS — M62830 Muscle spasm of back: Secondary | ICD-10-CM | POA: Diagnosis not present

## 2019-09-23 DIAGNOSIS — M48061 Spinal stenosis, lumbar region without neurogenic claudication: Secondary | ICD-10-CM | POA: Diagnosis not present

## 2019-09-23 DIAGNOSIS — M5442 Lumbago with sciatica, left side: Secondary | ICD-10-CM | POA: Diagnosis not present

## 2019-09-28 DIAGNOSIS — I10 Essential (primary) hypertension: Secondary | ICD-10-CM | POA: Diagnosis not present

## 2019-09-28 DIAGNOSIS — E119 Type 2 diabetes mellitus without complications: Secondary | ICD-10-CM | POA: Diagnosis not present

## 2019-10-07 DIAGNOSIS — E1165 Type 2 diabetes mellitus with hyperglycemia: Secondary | ICD-10-CM | POA: Diagnosis not present

## 2019-10-07 DIAGNOSIS — I119 Hypertensive heart disease without heart failure: Secondary | ICD-10-CM | POA: Diagnosis not present

## 2019-10-07 DIAGNOSIS — E669 Obesity, unspecified: Secondary | ICD-10-CM | POA: Diagnosis not present

## 2019-10-07 DIAGNOSIS — M5442 Lumbago with sciatica, left side: Secondary | ICD-10-CM | POA: Diagnosis not present

## 2019-10-07 DIAGNOSIS — Z6828 Body mass index (BMI) 28.0-28.9, adult: Secondary | ICD-10-CM | POA: Diagnosis not present

## 2019-10-13 DIAGNOSIS — M47812 Spondylosis without myelopathy or radiculopathy, cervical region: Secondary | ICD-10-CM | POA: Diagnosis not present

## 2019-10-13 DIAGNOSIS — Z6828 Body mass index (BMI) 28.0-28.9, adult: Secondary | ICD-10-CM | POA: Diagnosis not present

## 2019-10-13 DIAGNOSIS — M542 Cervicalgia: Secondary | ICD-10-CM | POA: Diagnosis not present

## 2019-10-13 DIAGNOSIS — M5442 Lumbago with sciatica, left side: Secondary | ICD-10-CM | POA: Diagnosis not present

## 2019-10-13 DIAGNOSIS — M47816 Spondylosis without myelopathy or radiculopathy, lumbar region: Secondary | ICD-10-CM | POA: Diagnosis not present

## 2019-10-28 DIAGNOSIS — E782 Mixed hyperlipidemia: Secondary | ICD-10-CM | POA: Diagnosis not present

## 2019-10-28 DIAGNOSIS — I10 Essential (primary) hypertension: Secondary | ICD-10-CM | POA: Diagnosis not present

## 2019-10-28 DIAGNOSIS — E1165 Type 2 diabetes mellitus with hyperglycemia: Secondary | ICD-10-CM | POA: Diagnosis not present

## 2019-11-04 DIAGNOSIS — M5416 Radiculopathy, lumbar region: Secondary | ICD-10-CM | POA: Diagnosis not present

## 2019-11-04 DIAGNOSIS — M4316 Spondylolisthesis, lumbar region: Secondary | ICD-10-CM | POA: Diagnosis not present

## 2019-11-04 DIAGNOSIS — M47816 Spondylosis without myelopathy or radiculopathy, lumbar region: Secondary | ICD-10-CM | POA: Diagnosis not present

## 2019-11-04 DIAGNOSIS — M5136 Other intervertebral disc degeneration, lumbar region: Secondary | ICD-10-CM | POA: Diagnosis not present

## 2019-11-28 DIAGNOSIS — E1165 Type 2 diabetes mellitus with hyperglycemia: Secondary | ICD-10-CM | POA: Diagnosis not present

## 2019-11-28 DIAGNOSIS — E782 Mixed hyperlipidemia: Secondary | ICD-10-CM | POA: Diagnosis not present

## 2019-11-28 DIAGNOSIS — K219 Gastro-esophageal reflux disease without esophagitis: Secondary | ICD-10-CM | POA: Diagnosis not present

## 2019-11-28 DIAGNOSIS — I10 Essential (primary) hypertension: Secondary | ICD-10-CM | POA: Diagnosis not present

## 2019-12-13 DIAGNOSIS — M545 Low back pain: Secondary | ICD-10-CM | POA: Diagnosis not present

## 2019-12-13 DIAGNOSIS — M5136 Other intervertebral disc degeneration, lumbar region: Secondary | ICD-10-CM | POA: Diagnosis not present

## 2019-12-16 DIAGNOSIS — M47816 Spondylosis without myelopathy or radiculopathy, lumbar region: Secondary | ICD-10-CM | POA: Diagnosis not present

## 2019-12-16 DIAGNOSIS — M5136 Other intervertebral disc degeneration, lumbar region: Secondary | ICD-10-CM | POA: Diagnosis not present

## 2019-12-16 DIAGNOSIS — M4316 Spondylolisthesis, lumbar region: Secondary | ICD-10-CM | POA: Diagnosis not present

## 2019-12-28 DIAGNOSIS — I10 Essential (primary) hypertension: Secondary | ICD-10-CM | POA: Diagnosis not present

## 2019-12-28 DIAGNOSIS — K219 Gastro-esophageal reflux disease without esophagitis: Secondary | ICD-10-CM | POA: Diagnosis not present

## 2019-12-28 DIAGNOSIS — E1165 Type 2 diabetes mellitus with hyperglycemia: Secondary | ICD-10-CM | POA: Diagnosis not present

## 2019-12-28 DIAGNOSIS — E782 Mixed hyperlipidemia: Secondary | ICD-10-CM | POA: Diagnosis not present

## 2020-01-12 DIAGNOSIS — R0981 Nasal congestion: Secondary | ICD-10-CM | POA: Diagnosis not present

## 2020-01-12 DIAGNOSIS — Z6827 Body mass index (BMI) 27.0-27.9, adult: Secondary | ICD-10-CM | POA: Diagnosis not present

## 2020-01-12 DIAGNOSIS — E1165 Type 2 diabetes mellitus with hyperglycemia: Secondary | ICD-10-CM | POA: Diagnosis not present

## 2020-01-12 DIAGNOSIS — E669 Obesity, unspecified: Secondary | ICD-10-CM | POA: Diagnosis not present

## 2020-01-12 DIAGNOSIS — R05 Cough: Secondary | ICD-10-CM | POA: Diagnosis not present

## 2020-01-12 DIAGNOSIS — J441 Chronic obstructive pulmonary disease with (acute) exacerbation: Secondary | ICD-10-CM | POA: Diagnosis not present

## 2020-01-26 DIAGNOSIS — J309 Allergic rhinitis, unspecified: Secondary | ICD-10-CM | POA: Diagnosis not present

## 2020-01-26 DIAGNOSIS — R0600 Dyspnea, unspecified: Secondary | ICD-10-CM | POA: Diagnosis not present

## 2020-01-26 DIAGNOSIS — J441 Chronic obstructive pulmonary disease with (acute) exacerbation: Secondary | ICD-10-CM | POA: Diagnosis not present

## 2020-01-26 DIAGNOSIS — M7989 Other specified soft tissue disorders: Secondary | ICD-10-CM | POA: Diagnosis not present

## 2020-01-26 DIAGNOSIS — Z6828 Body mass index (BMI) 28.0-28.9, adult: Secondary | ICD-10-CM | POA: Diagnosis not present

## 2020-01-26 DIAGNOSIS — R0982 Postnasal drip: Secondary | ICD-10-CM | POA: Diagnosis not present

## 2020-01-28 DIAGNOSIS — E1165 Type 2 diabetes mellitus with hyperglycemia: Secondary | ICD-10-CM | POA: Diagnosis not present

## 2020-01-28 DIAGNOSIS — E782 Mixed hyperlipidemia: Secondary | ICD-10-CM | POA: Diagnosis not present

## 2020-01-28 DIAGNOSIS — I10 Essential (primary) hypertension: Secondary | ICD-10-CM | POA: Diagnosis not present

## 2020-01-28 DIAGNOSIS — K219 Gastro-esophageal reflux disease without esophagitis: Secondary | ICD-10-CM | POA: Diagnosis not present

## 2020-02-28 DIAGNOSIS — I10 Essential (primary) hypertension: Secondary | ICD-10-CM | POA: Diagnosis not present

## 2020-02-28 DIAGNOSIS — E782 Mixed hyperlipidemia: Secondary | ICD-10-CM | POA: Diagnosis not present

## 2020-02-28 DIAGNOSIS — K219 Gastro-esophageal reflux disease without esophagitis: Secondary | ICD-10-CM | POA: Diagnosis not present

## 2020-02-28 DIAGNOSIS — E1165 Type 2 diabetes mellitus with hyperglycemia: Secondary | ICD-10-CM | POA: Diagnosis not present

## 2020-03-02 DIAGNOSIS — M5136 Other intervertebral disc degeneration, lumbar region: Secondary | ICD-10-CM | POA: Diagnosis not present

## 2020-03-02 DIAGNOSIS — M4316 Spondylolisthesis, lumbar region: Secondary | ICD-10-CM | POA: Diagnosis not present

## 2020-03-02 DIAGNOSIS — M47816 Spondylosis without myelopathy or radiculopathy, lumbar region: Secondary | ICD-10-CM | POA: Diagnosis not present

## 2020-03-02 DIAGNOSIS — M47812 Spondylosis without myelopathy or radiculopathy, cervical region: Secondary | ICD-10-CM | POA: Diagnosis not present

## 2020-03-08 DIAGNOSIS — B351 Tinea unguium: Secondary | ICD-10-CM | POA: Diagnosis not present

## 2020-03-08 DIAGNOSIS — J42 Unspecified chronic bronchitis: Secondary | ICD-10-CM | POA: Diagnosis not present

## 2020-03-26 ENCOUNTER — Ambulatory Visit: Payer: Medicare HMO | Admitting: Podiatry

## 2020-03-26 ENCOUNTER — Encounter: Payer: Self-pay | Admitting: Podiatry

## 2020-03-26 ENCOUNTER — Other Ambulatory Visit: Payer: Self-pay

## 2020-03-26 DIAGNOSIS — B351 Tinea unguium: Secondary | ICD-10-CM

## 2020-03-26 DIAGNOSIS — E1151 Type 2 diabetes mellitus with diabetic peripheral angiopathy without gangrene: Secondary | ICD-10-CM | POA: Diagnosis not present

## 2020-03-26 DIAGNOSIS — M2041 Other hammer toe(s) (acquired), right foot: Secondary | ICD-10-CM

## 2020-03-26 DIAGNOSIS — L84 Corns and callosities: Secondary | ICD-10-CM | POA: Diagnosis not present

## 2020-03-26 DIAGNOSIS — M2042 Other hammer toe(s) (acquired), left foot: Secondary | ICD-10-CM

## 2020-03-26 NOTE — Progress Notes (Signed)
  Subjective:  Patient ID: Carol Torres, female    DOB: 06-01-1946,  MRN: [id number]  Chief Complaint  Patient presents with  . Nail Problem    the nails may need to be trimmed and i am a diabetic   . Foot Problem    sometimes my toes look a blue color or dirty color    74 y.o. female presents with the above complaint. History confirmed with patient. Denies numbness and tingling in the feet  Objective:  Physical Exam: warm, delayed capillary refill, nail exam onychomycosis of the toenails, trophic pedal changes with onychomycosis. No ulcerative lesions. DP pulses palpable, PT pulses palpable and protective sensation intact Left Foot: left 3rd toe distal excoriation, no evidence of corn. Hammertoes present. HPK submet 3, 5 Right Foot: hammertoes present. HPK submet 3  No images are attached to the encounter.  Assessment:   1. Diabetes mellitus type 2 with peripheral artery disease ([place])   2. Onychomycosis   3. Callus   4. Hammertoes of both feet      Plan:  Patient was evaluated and treated and all questions answered.  Onychomycosis, Diabetes and PAD -Patient is diabetic with a qualifying condition for at risk foot care. -No evidence of true corn, likely excoriation. Apply neosporin and band-aid daily.  Procedure: Nail Debridement Rationale: Patient meets criteria for routine foot care due to PAD Type of Debridement: manual, sharp debridement. Instrumentation: Nail nipper, rotary burr. Number of Nails: 10   Procedure: Paring of Lesion Rationale: painful hyperkeratotic lesion Type of Debridement: manual, sharp debridement. Instrumentation: 312 blade Number of Lesions: 3  Return in about 1 month (around 04/25/2020) for skin lesion f/u.

## 2020-03-29 DIAGNOSIS — E782 Mixed hyperlipidemia: Secondary | ICD-10-CM | POA: Diagnosis not present

## 2020-03-29 DIAGNOSIS — E1165 Type 2 diabetes mellitus with hyperglycemia: Secondary | ICD-10-CM | POA: Diagnosis not present

## 2020-03-29 DIAGNOSIS — K219 Gastro-esophageal reflux disease without esophagitis: Secondary | ICD-10-CM | POA: Diagnosis not present

## 2020-03-29 DIAGNOSIS — I10 Essential (primary) hypertension: Secondary | ICD-10-CM | POA: Diagnosis not present

## 2020-04-13 DIAGNOSIS — E1165 Type 2 diabetes mellitus with hyperglycemia: Secondary | ICD-10-CM | POA: Diagnosis not present

## 2020-04-13 DIAGNOSIS — Z23 Encounter for immunization: Secondary | ICD-10-CM | POA: Diagnosis not present

## 2020-04-13 DIAGNOSIS — Z6829 Body mass index (BMI) 29.0-29.9, adult: Secondary | ICD-10-CM | POA: Diagnosis not present

## 2020-04-13 DIAGNOSIS — R0600 Dyspnea, unspecified: Secondary | ICD-10-CM | POA: Diagnosis not present

## 2020-04-23 DIAGNOSIS — Z1231 Encounter for screening mammogram for malignant neoplasm of breast: Secondary | ICD-10-CM | POA: Diagnosis not present

## 2020-04-28 DIAGNOSIS — K219 Gastro-esophageal reflux disease without esophagitis: Secondary | ICD-10-CM | POA: Diagnosis not present

## 2020-04-28 DIAGNOSIS — E1165 Type 2 diabetes mellitus with hyperglycemia: Secondary | ICD-10-CM | POA: Diagnosis not present

## 2020-04-28 DIAGNOSIS — I10 Essential (primary) hypertension: Secondary | ICD-10-CM | POA: Diagnosis not present

## 2020-04-28 DIAGNOSIS — E782 Mixed hyperlipidemia: Secondary | ICD-10-CM | POA: Diagnosis not present

## 2020-05-29 DIAGNOSIS — E1165 Type 2 diabetes mellitus with hyperglycemia: Secondary | ICD-10-CM | POA: Diagnosis not present

## 2020-05-29 DIAGNOSIS — K219 Gastro-esophageal reflux disease without esophagitis: Secondary | ICD-10-CM | POA: Diagnosis not present

## 2020-05-29 DIAGNOSIS — E782 Mixed hyperlipidemia: Secondary | ICD-10-CM | POA: Diagnosis not present

## 2020-05-29 DIAGNOSIS — I10 Essential (primary) hypertension: Secondary | ICD-10-CM | POA: Diagnosis not present

## 2020-05-30 DIAGNOSIS — Z23 Encounter for immunization: Secondary | ICD-10-CM | POA: Diagnosis not present

## 2020-06-25 ENCOUNTER — Encounter: Payer: Self-pay | Admitting: Podiatry

## 2020-06-25 ENCOUNTER — Other Ambulatory Visit: Payer: Self-pay | Admitting: *Deleted

## 2020-06-25 ENCOUNTER — Other Ambulatory Visit: Payer: Self-pay

## 2020-06-25 ENCOUNTER — Ambulatory Visit: Payer: Medicare HMO | Admitting: Podiatry

## 2020-06-25 DIAGNOSIS — B351 Tinea unguium: Secondary | ICD-10-CM | POA: Diagnosis not present

## 2020-06-25 DIAGNOSIS — E1169 Type 2 diabetes mellitus with other specified complication: Secondary | ICD-10-CM | POA: Diagnosis not present

## 2020-06-25 DIAGNOSIS — E1151 Type 2 diabetes mellitus with diabetic peripheral angiopathy without gangrene: Secondary | ICD-10-CM | POA: Diagnosis not present

## 2020-06-25 NOTE — Progress Notes (Signed)
  Subjective:  Patient ID: Carol Torres, female    DOB: 04-Apr-1946,  MRN: [id number]  Chief Complaint  Patient presents with  . Nail Problem    Trim my nails     74 y.o. female presents with the above complaint. History confirmed with patient. Denies numbness and tingling in the feet  Objective:  Physical Exam: warm, delayed capillary refill, nail exam onychomycosis of the toenails, trophic pedal changes with onychomycosis. No ulcerative lesions. DP pulses palpable, PT pulses palpable and protective sensation intact Left Foot: left 3rd toe distal excoriation, no evidence of corn. Hammertoes present. HPK submet 3, 5 Right Foot: hammertoes present. HPK submet 3  No images are attached to the encounter.  Assessment:   1. Onychomycosis of multiple toenails with type 2 diabetes mellitus and peripheral angiopathy (HCC)    Plan:  Patient was evaluated and treated and all questions answered.  Onychomycosis, Diabetes and PAD -Patient is diabetic with a qualifying condition for at risk foot care.  Procedure: Nail Debridement Type of Debridement: manual, sharp debridement. Instrumentation: Nail nipper, rotary burr. Number of Nails: 10   No follow-ups on file.

## 2020-06-28 DIAGNOSIS — E1165 Type 2 diabetes mellitus with hyperglycemia: Secondary | ICD-10-CM | POA: Diagnosis not present

## 2020-06-28 DIAGNOSIS — I119 Hypertensive heart disease without heart failure: Secondary | ICD-10-CM | POA: Diagnosis not present

## 2020-07-13 DIAGNOSIS — R0982 Postnasal drip: Secondary | ICD-10-CM | POA: Diagnosis not present

## 2020-07-13 DIAGNOSIS — J309 Allergic rhinitis, unspecified: Secondary | ICD-10-CM | POA: Diagnosis not present

## 2020-07-13 DIAGNOSIS — I119 Hypertensive heart disease without heart failure: Secondary | ICD-10-CM | POA: Diagnosis not present

## 2020-07-13 DIAGNOSIS — Z13220 Encounter for screening for lipoid disorders: Secondary | ICD-10-CM | POA: Diagnosis not present

## 2020-07-13 DIAGNOSIS — J42 Unspecified chronic bronchitis: Secondary | ICD-10-CM | POA: Diagnosis not present

## 2020-07-13 DIAGNOSIS — Z0000 Encounter for general adult medical examination without abnormal findings: Secondary | ICD-10-CM | POA: Diagnosis not present

## 2020-07-13 DIAGNOSIS — E1165 Type 2 diabetes mellitus with hyperglycemia: Secondary | ICD-10-CM | POA: Diagnosis not present

## 2020-07-13 DIAGNOSIS — Z6828 Body mass index (BMI) 28.0-28.9, adult: Secondary | ICD-10-CM | POA: Diagnosis not present

## 2020-07-13 DIAGNOSIS — Z79899 Other long term (current) drug therapy: Secondary | ICD-10-CM | POA: Diagnosis not present

## 2020-07-13 DIAGNOSIS — Z13820 Encounter for screening for osteoporosis: Secondary | ICD-10-CM | POA: Diagnosis not present

## 2020-07-29 DIAGNOSIS — E782 Mixed hyperlipidemia: Secondary | ICD-10-CM | POA: Diagnosis not present

## 2020-07-29 DIAGNOSIS — E1165 Type 2 diabetes mellitus with hyperglycemia: Secondary | ICD-10-CM | POA: Diagnosis not present

## 2020-07-29 DIAGNOSIS — K219 Gastro-esophageal reflux disease without esophagitis: Secondary | ICD-10-CM | POA: Diagnosis not present

## 2020-07-29 DIAGNOSIS — I10 Essential (primary) hypertension: Secondary | ICD-10-CM | POA: Diagnosis not present

## 2020-08-27 DIAGNOSIS — E1165 Type 2 diabetes mellitus with hyperglycemia: Secondary | ICD-10-CM | POA: Diagnosis not present

## 2020-08-27 DIAGNOSIS — I10 Essential (primary) hypertension: Secondary | ICD-10-CM | POA: Diagnosis not present

## 2020-08-27 DIAGNOSIS — K219 Gastro-esophageal reflux disease without esophagitis: Secondary | ICD-10-CM | POA: Diagnosis not present

## 2020-08-27 DIAGNOSIS — E782 Mixed hyperlipidemia: Secondary | ICD-10-CM | POA: Diagnosis not present

## 2020-09-24 ENCOUNTER — Ambulatory Visit: Payer: Medicare HMO | Admitting: Podiatry

## 2020-09-26 DIAGNOSIS — K219 Gastro-esophageal reflux disease without esophagitis: Secondary | ICD-10-CM | POA: Diagnosis not present

## 2020-09-26 DIAGNOSIS — E1165 Type 2 diabetes mellitus with hyperglycemia: Secondary | ICD-10-CM | POA: Diagnosis not present

## 2020-09-26 DIAGNOSIS — I10 Essential (primary) hypertension: Secondary | ICD-10-CM | POA: Diagnosis not present

## 2020-09-26 DIAGNOSIS — E782 Mixed hyperlipidemia: Secondary | ICD-10-CM | POA: Diagnosis not present

## 2020-11-26 DIAGNOSIS — E1165 Type 2 diabetes mellitus with hyperglycemia: Secondary | ICD-10-CM | POA: Diagnosis not present

## 2020-11-26 DIAGNOSIS — I10 Essential (primary) hypertension: Secondary | ICD-10-CM | POA: Diagnosis not present

## 2020-11-26 DIAGNOSIS — E782 Mixed hyperlipidemia: Secondary | ICD-10-CM | POA: Diagnosis not present

## 2020-11-26 DIAGNOSIS — K219 Gastro-esophageal reflux disease without esophagitis: Secondary | ICD-10-CM | POA: Diagnosis not present

## 2020-12-17 ENCOUNTER — Other Ambulatory Visit: Payer: Self-pay

## 2020-12-17 ENCOUNTER — Ambulatory Visit: Payer: Medicare HMO | Admitting: Podiatry

## 2020-12-17 DIAGNOSIS — E1151 Type 2 diabetes mellitus with diabetic peripheral angiopathy without gangrene: Secondary | ICD-10-CM

## 2020-12-17 DIAGNOSIS — E1169 Type 2 diabetes mellitus with other specified complication: Secondary | ICD-10-CM | POA: Diagnosis not present

## 2020-12-17 DIAGNOSIS — B351 Tinea unguium: Secondary | ICD-10-CM

## 2020-12-17 NOTE — Progress Notes (Signed)
  Subjective:  Patient ID: Carol Torres, female    DOB: Mar 27, 1946,  MRN: [id number]  Chief Complaint  Patient presents with   debride    DFC -FBS: 130 a1C: 6 PCP: [facility] x 5 mo   Foot Problem    Bruising and swelling at Lt 2nd-4th toes x 2 wks; no injury no pain -noticed after wlaking tx: elevation     75 y.o. female presents with the above complaint. History confirmed with patient.   Objective:  Physical Exam: warm, delayed capillary refill, nail exam onychomycosis of the toenails, trophic pedal changes with onychomycosis. No ulcerative lesions. DP pulses palpable, PT pulses palpable and protective sensation intact Left Foot:  Hammertoes present. HPK submet 3, 5. Left foot 2nd/3rd mild contusions no pain. Right Foot: hammertoes present. HPK submet 3  No images are attached to the encounter.  Assessment:   1. Onychomycosis of multiple toenails with type 2 diabetes mellitus and peripheral angiopathy ([place])    Plan:  Patient was evaluated and treated and all questions answered.  Onychomycosis, Diabetes and PAD -Patient is diabetic with a qualifying condition for at risk foot care.  Procedure: Nail Debridement Type of Debridement: manual, sharp debridement. Instrumentation: Nail nipper, rotary burr. Number of Nails: 10  Contusions Left Foot -Unclear etiology -Should resolve without issues -F/u should issues persist.  Return in about 3 months (around 03/19/2021) for Diabetic Foot Care.

## 2020-12-27 DIAGNOSIS — K219 Gastro-esophageal reflux disease without esophagitis: Secondary | ICD-10-CM | POA: Diagnosis not present

## 2020-12-27 DIAGNOSIS — E782 Mixed hyperlipidemia: Secondary | ICD-10-CM | POA: Diagnosis not present

## 2020-12-27 DIAGNOSIS — E1165 Type 2 diabetes mellitus with hyperglycemia: Secondary | ICD-10-CM | POA: Diagnosis not present

## 2020-12-27 DIAGNOSIS — I10 Essential (primary) hypertension: Secondary | ICD-10-CM | POA: Diagnosis not present

## 2021-01-09 DIAGNOSIS — Z6829 Body mass index (BMI) 29.0-29.9, adult: Secondary | ICD-10-CM | POA: Diagnosis not present

## 2021-01-09 DIAGNOSIS — J42 Unspecified chronic bronchitis: Secondary | ICD-10-CM | POA: Diagnosis not present

## 2021-01-09 DIAGNOSIS — Z1331 Encounter for screening for depression: Secondary | ICD-10-CM | POA: Diagnosis not present

## 2021-01-09 DIAGNOSIS — E1165 Type 2 diabetes mellitus with hyperglycemia: Secondary | ICD-10-CM | POA: Diagnosis not present

## 2021-01-09 DIAGNOSIS — I119 Hypertensive heart disease without heart failure: Secondary | ICD-10-CM | POA: Diagnosis not present

## 2021-01-09 DIAGNOSIS — Z9181 History of falling: Secondary | ICD-10-CM | POA: Diagnosis not present

## 2021-01-09 DIAGNOSIS — N3001 Acute cystitis with hematuria: Secondary | ICD-10-CM | POA: Diagnosis not present

## 2021-01-27 DIAGNOSIS — K219 Gastro-esophageal reflux disease without esophagitis: Secondary | ICD-10-CM | POA: Diagnosis not present

## 2021-01-27 DIAGNOSIS — E1165 Type 2 diabetes mellitus with hyperglycemia: Secondary | ICD-10-CM | POA: Diagnosis not present

## 2021-01-27 DIAGNOSIS — E782 Mixed hyperlipidemia: Secondary | ICD-10-CM | POA: Diagnosis not present

## 2021-01-27 DIAGNOSIS — I10 Essential (primary) hypertension: Secondary | ICD-10-CM | POA: Diagnosis not present

## 2021-01-30 DIAGNOSIS — I951 Orthostatic hypotension: Secondary | ICD-10-CM | POA: Diagnosis not present

## 2021-01-30 DIAGNOSIS — N3001 Acute cystitis with hematuria: Secondary | ICD-10-CM | POA: Diagnosis not present

## 2021-01-30 DIAGNOSIS — E1165 Type 2 diabetes mellitus with hyperglycemia: Secondary | ICD-10-CM | POA: Diagnosis not present

## 2021-01-30 DIAGNOSIS — I119 Hypertensive heart disease without heart failure: Secondary | ICD-10-CM | POA: Diagnosis not present

## 2021-01-30 DIAGNOSIS — Z6827 Body mass index (BMI) 27.0-27.9, adult: Secondary | ICD-10-CM | POA: Diagnosis not present

## 2021-02-19 DIAGNOSIS — I119 Hypertensive heart disease without heart failure: Secondary | ICD-10-CM | POA: Diagnosis not present

## 2021-02-19 DIAGNOSIS — Z6827 Body mass index (BMI) 27.0-27.9, adult: Secondary | ICD-10-CM | POA: Diagnosis not present

## 2021-02-19 DIAGNOSIS — N3001 Acute cystitis with hematuria: Secondary | ICD-10-CM | POA: Diagnosis not present

## 2021-02-19 DIAGNOSIS — R0609 Other forms of dyspnea: Secondary | ICD-10-CM | POA: Diagnosis not present

## 2021-02-19 DIAGNOSIS — R600 Localized edema: Secondary | ICD-10-CM | POA: Diagnosis not present

## 2021-02-19 DIAGNOSIS — E1165 Type 2 diabetes mellitus with hyperglycemia: Secondary | ICD-10-CM | POA: Diagnosis not present

## 2021-02-27 DIAGNOSIS — E782 Mixed hyperlipidemia: Secondary | ICD-10-CM | POA: Diagnosis not present

## 2021-02-27 DIAGNOSIS — K219 Gastro-esophageal reflux disease without esophagitis: Secondary | ICD-10-CM | POA: Diagnosis not present

## 2021-02-27 DIAGNOSIS — I10 Essential (primary) hypertension: Secondary | ICD-10-CM | POA: Diagnosis not present

## 2021-02-27 DIAGNOSIS — E1165 Type 2 diabetes mellitus with hyperglycemia: Secondary | ICD-10-CM | POA: Diagnosis not present

## 2021-03-29 DIAGNOSIS — E785 Hyperlipidemia, unspecified: Secondary | ICD-10-CM | POA: Diagnosis not present

## 2021-03-29 DIAGNOSIS — I119 Hypertensive heart disease without heart failure: Secondary | ICD-10-CM | POA: Diagnosis not present

## 2021-03-29 DIAGNOSIS — E1165 Type 2 diabetes mellitus with hyperglycemia: Secondary | ICD-10-CM | POA: Diagnosis not present

## 2021-04-05 DIAGNOSIS — I110 Hypertensive heart disease with heart failure: Secondary | ICD-10-CM | POA: Diagnosis not present

## 2021-04-05 DIAGNOSIS — E1165 Type 2 diabetes mellitus with hyperglycemia: Secondary | ICD-10-CM | POA: Diagnosis not present

## 2021-04-05 DIAGNOSIS — Z6827 Body mass index (BMI) 27.0-27.9, adult: Secondary | ICD-10-CM | POA: Diagnosis not present

## 2021-04-05 DIAGNOSIS — Z23 Encounter for immunization: Secondary | ICD-10-CM | POA: Diagnosis not present

## 2021-04-18 ENCOUNTER — Encounter: Payer: Self-pay | Admitting: Podiatry

## 2021-04-18 ENCOUNTER — Ambulatory Visit: Payer: Medicare HMO | Admitting: Podiatry

## 2021-04-18 ENCOUNTER — Other Ambulatory Visit: Payer: Self-pay

## 2021-04-18 DIAGNOSIS — E1169 Type 2 diabetes mellitus with other specified complication: Secondary | ICD-10-CM | POA: Diagnosis not present

## 2021-04-18 DIAGNOSIS — B351 Tinea unguium: Secondary | ICD-10-CM

## 2021-04-18 DIAGNOSIS — L84 Corns and callosities: Secondary | ICD-10-CM

## 2021-04-18 DIAGNOSIS — E1151 Type 2 diabetes mellitus with diabetic peripheral angiopathy without gangrene: Secondary | ICD-10-CM

## 2021-04-18 NOTE — Progress Notes (Signed)
  Subjective:  Patient ID: Carol Torres, female    DOB: [birth year],  MRN: [id number]  Chief Complaint  Patient presents with   Callouses    Trim calluses   Nail Problem    Trim nails     75 y.o. female presents with the above complaint. History confirmed with patient. Denies new pedal issues - has been on lasix which has helped with her swelling  Objective:  Physical Exam: warm, delayed capillary refill, nail exam onychomycosis of the toenails, trophic pedal changes with onychomycosis. No ulcerative lesions. DP pulses palpable, PT pulses palpable and protective sensation intact Left Foot:  Hammertoes present. HPK submet 3, 5.   Right Foot: hammertoes present. HPK submet 3  No images are attached to the encounter.  Assessment:   1. Onychomycosis of multiple toenails with type 2 diabetes mellitus and peripheral angiopathy (HCC)   2. Callus   3. Diabetes mellitus type 2 with peripheral artery disease ([place])    Plan:  Patient was evaluated and treated and all questions answered.  Onychomycosis, Diabetes and PAD -Patient is diabetic with a qualifying condition for at risk foot care.   Procedure: Nail Debridement Type of Debridement: manual, sharp debridement. Instrumentation: Nail nipper, rotary burr. Number of Nails: 10   Procedure: Paring of Lesion Rationale: painful hyperkeratotic lesion Type of Debridement: manual, sharp debridement. Instrumentation: 312 blade Number of Lesions: 3   No follow-ups on file.

## 2021-04-25 DIAGNOSIS — Z6827 Body mass index (BMI) 27.0-27.9, adult: Secondary | ICD-10-CM | POA: Diagnosis not present

## 2021-04-25 DIAGNOSIS — N179 Acute kidney failure, unspecified: Secondary | ICD-10-CM | POA: Diagnosis not present

## 2021-04-25 DIAGNOSIS — Z7689 Persons encountering health services in other specified circumstances: Secondary | ICD-10-CM | POA: Diagnosis not present

## 2021-04-25 DIAGNOSIS — N3001 Acute cystitis with hematuria: Secondary | ICD-10-CM | POA: Diagnosis not present

## 2021-04-29 DIAGNOSIS — E1165 Type 2 diabetes mellitus with hyperglycemia: Secondary | ICD-10-CM | POA: Diagnosis not present

## 2021-04-29 DIAGNOSIS — E785 Hyperlipidemia, unspecified: Secondary | ICD-10-CM | POA: Diagnosis not present

## 2021-04-29 DIAGNOSIS — I119 Hypertensive heart disease without heart failure: Secondary | ICD-10-CM | POA: Diagnosis not present

## 2021-05-14 DIAGNOSIS — Z6827 Body mass index (BMI) 27.0-27.9, adult: Secondary | ICD-10-CM | POA: Diagnosis not present

## 2021-05-14 DIAGNOSIS — I110 Hypertensive heart disease with heart failure: Secondary | ICD-10-CM | POA: Diagnosis not present

## 2021-05-14 DIAGNOSIS — N183 Chronic kidney disease, stage 3 unspecified: Secondary | ICD-10-CM | POA: Diagnosis not present

## 2021-05-14 DIAGNOSIS — E1122 Type 2 diabetes mellitus with diabetic chronic kidney disease: Secondary | ICD-10-CM | POA: Diagnosis not present

## 2021-05-14 DIAGNOSIS — N3001 Acute cystitis with hematuria: Secondary | ICD-10-CM | POA: Diagnosis not present

## 2021-05-14 DIAGNOSIS — E1165 Type 2 diabetes mellitus with hyperglycemia: Secondary | ICD-10-CM | POA: Diagnosis not present

## 2021-05-21 ENCOUNTER — Encounter: Payer: Self-pay | Admitting: Cardiology

## 2021-05-29 DIAGNOSIS — I119 Hypertensive heart disease without heart failure: Secondary | ICD-10-CM | POA: Diagnosis not present

## 2021-05-29 DIAGNOSIS — E785 Hyperlipidemia, unspecified: Secondary | ICD-10-CM | POA: Diagnosis not present

## 2021-05-29 DIAGNOSIS — E1165 Type 2 diabetes mellitus with hyperglycemia: Secondary | ICD-10-CM | POA: Diagnosis not present

## 2021-06-19 DIAGNOSIS — E119 Type 2 diabetes mellitus without complications: Secondary | ICD-10-CM | POA: Insufficient documentation

## 2021-06-19 DIAGNOSIS — I10 Essential (primary) hypertension: Secondary | ICD-10-CM | POA: Insufficient documentation

## 2021-06-19 DIAGNOSIS — K5790 Diverticulosis of intestine, part unspecified, without perforation or abscess without bleeding: Secondary | ICD-10-CM | POA: Insufficient documentation

## 2021-06-19 DIAGNOSIS — I110 Hypertensive heart disease with heart failure: Secondary | ICD-10-CM | POA: Insufficient documentation

## 2021-06-19 DIAGNOSIS — E7800 Pure hypercholesterolemia, unspecified: Secondary | ICD-10-CM | POA: Insufficient documentation

## 2021-06-19 DIAGNOSIS — M5430 Sciatica, unspecified side: Secondary | ICD-10-CM | POA: Insufficient documentation

## 2021-06-19 DIAGNOSIS — E1122 Type 2 diabetes mellitus with diabetic chronic kidney disease: Secondary | ICD-10-CM | POA: Insufficient documentation

## 2021-06-28 DIAGNOSIS — E1165 Type 2 diabetes mellitus with hyperglycemia: Secondary | ICD-10-CM | POA: Diagnosis not present

## 2021-06-28 DIAGNOSIS — E785 Hyperlipidemia, unspecified: Secondary | ICD-10-CM | POA: Diagnosis not present

## 2021-06-28 DIAGNOSIS — I119 Hypertensive heart disease without heart failure: Secondary | ICD-10-CM | POA: Diagnosis not present

## 2021-07-02 DIAGNOSIS — Z1231 Encounter for screening mammogram for malignant neoplasm of breast: Secondary | ICD-10-CM | POA: Diagnosis not present

## 2021-07-04 ENCOUNTER — Ambulatory Visit: Payer: Self-pay | Admitting: Cardiology

## 2021-07-16 DIAGNOSIS — J42 Unspecified chronic bronchitis: Secondary | ICD-10-CM | POA: Diagnosis not present

## 2021-07-16 DIAGNOSIS — I110 Hypertensive heart disease with heart failure: Secondary | ICD-10-CM | POA: Diagnosis not present

## 2021-07-16 DIAGNOSIS — Z79899 Other long term (current) drug therapy: Secondary | ICD-10-CM | POA: Diagnosis not present

## 2021-07-16 DIAGNOSIS — Z6828 Body mass index (BMI) 28.0-28.9, adult: Secondary | ICD-10-CM | POA: Diagnosis not present

## 2021-07-16 DIAGNOSIS — R0609 Other forms of dyspnea: Secondary | ICD-10-CM | POA: Diagnosis not present

## 2021-07-16 DIAGNOSIS — Z0000 Encounter for general adult medical examination without abnormal findings: Secondary | ICD-10-CM | POA: Diagnosis not present

## 2021-07-16 DIAGNOSIS — E785 Hyperlipidemia, unspecified: Secondary | ICD-10-CM | POA: Diagnosis not present

## 2021-07-16 DIAGNOSIS — Z7689 Persons encountering health services in other specified circumstances: Secondary | ICD-10-CM | POA: Diagnosis not present

## 2021-07-16 DIAGNOSIS — E1122 Type 2 diabetes mellitus with diabetic chronic kidney disease: Secondary | ICD-10-CM | POA: Diagnosis not present

## 2021-07-16 DIAGNOSIS — N3001 Acute cystitis with hematuria: Secondary | ICD-10-CM | POA: Diagnosis not present

## 2021-07-16 DIAGNOSIS — R300 Dysuria: Secondary | ICD-10-CM | POA: Diagnosis not present

## 2021-07-16 DIAGNOSIS — N183 Chronic kidney disease, stage 3 unspecified: Secondary | ICD-10-CM | POA: Diagnosis not present

## 2021-07-22 ENCOUNTER — Encounter: Payer: Self-pay | Admitting: Podiatry

## 2021-07-22 ENCOUNTER — Other Ambulatory Visit: Payer: Self-pay

## 2021-07-22 ENCOUNTER — Ambulatory Visit: Payer: Medicare HMO | Admitting: Podiatry

## 2021-07-22 DIAGNOSIS — M25372 Other instability, left ankle: Secondary | ICD-10-CM

## 2021-07-22 DIAGNOSIS — B351 Tinea unguium: Secondary | ICD-10-CM

## 2021-07-22 DIAGNOSIS — E1169 Type 2 diabetes mellitus with other specified complication: Secondary | ICD-10-CM

## 2021-07-22 DIAGNOSIS — E1151 Type 2 diabetes mellitus with diabetic peripheral angiopathy without gangrene: Secondary | ICD-10-CM | POA: Diagnosis not present

## 2021-07-22 DIAGNOSIS — L84 Corns and callosities: Secondary | ICD-10-CM | POA: Diagnosis not present

## 2021-07-22 NOTE — Progress Notes (Signed)
°  Subjective:  Patient ID: Carol Torres, female    DOB: 12-03-1945,  MRN: [id number]  Chief Complaint  Patient presents with   Callouses    I have some calluses on the ball of both feet   Nail Problem    Trim nails    76 y.o. female presents with the above complaint. History confirmed with patient. Complains of some instability in her left ankle, but otherwise no complaints. States she sometimes feels like her ankle catches. Happens in the morning often but sometimes throughout the day.  Objective:  Physical Exam: warm, delayed capillary refill, nail exam onychomycosis of the toenails, trophic pedal changes with onychomycosis. No ulcerative lesions. DP pulses palpable, PT pulses palpable and protective sensation intact Left Foot:  Hammertoes present. HPK submet 3, 5.   Right Foot: hammertoes present. HPK submet 3 POP left ATFL. Negative anterior drawer/talar tilt. Inv/Ev ROM equal and normal bilat  No images are attached to the encounter.  Assessment:   1. Onychomycosis of multiple toenails with type 2 diabetes mellitus and peripheral angiopathy (HCC)   2. Callus   3. Diabetes mellitus type 2 with peripheral artery disease ([place])   4. Ankle instability, left     Plan:  Patient was evaluated and treated and all questions answered.  Onychomycosis, Diabetes and PAD -Patient is diabetic with a qualifying condition for at risk foot care.  Procedure: Nail Debridement Type of Debridement: manual, sharp debridement. Instrumentation: Nail nipper, rotary burr. Number of Nails: 10   Procedure: Paring of Lesion Rationale: painful hyperkeratotic lesion Type of Debridement: manual, sharp debridement. Instrumentation: 312 blade Number of Lesions: 3  Ankle instability left -Discussed etiology -Recommend balance and proprioception exercises. Educated for patient. -Should this worsen or not improve could consider ankle brace and PT for this. Could also consider balance  bracing.  Return in about 3 months (around 10/20/2021) for Diabetic Foot Care.

## 2021-07-23 DIAGNOSIS — R0609 Other forms of dyspnea: Secondary | ICD-10-CM | POA: Diagnosis not present

## 2021-07-29 DIAGNOSIS — I10 Essential (primary) hypertension: Secondary | ICD-10-CM | POA: Diagnosis not present

## 2021-07-29 DIAGNOSIS — H353112 Nonexudative age-related macular degeneration, right eye, intermediate dry stage: Secondary | ICD-10-CM | POA: Diagnosis not present

## 2021-07-29 DIAGNOSIS — H353121 Nonexudative age-related macular degeneration, left eye, early dry stage: Secondary | ICD-10-CM | POA: Diagnosis not present

## 2021-07-29 DIAGNOSIS — H401131 Primary open-angle glaucoma, bilateral, mild stage: Secondary | ICD-10-CM | POA: Diagnosis not present

## 2021-07-29 DIAGNOSIS — H5203 Hypermetropia, bilateral: Secondary | ICD-10-CM | POA: Diagnosis not present

## 2021-07-29 DIAGNOSIS — H52223 Regular astigmatism, bilateral: Secondary | ICD-10-CM | POA: Diagnosis not present

## 2021-07-29 DIAGNOSIS — Z7984 Long term (current) use of oral hypoglycemic drugs: Secondary | ICD-10-CM | POA: Diagnosis not present

## 2021-07-29 DIAGNOSIS — E119 Type 2 diabetes mellitus without complications: Secondary | ICD-10-CM | POA: Diagnosis not present

## 2021-07-29 DIAGNOSIS — H25813 Combined forms of age-related cataract, bilateral: Secondary | ICD-10-CM | POA: Diagnosis not present

## 2021-07-29 DIAGNOSIS — H47233 Glaucomatous optic atrophy, bilateral: Secondary | ICD-10-CM | POA: Diagnosis not present

## 2021-07-30 DIAGNOSIS — I119 Hypertensive heart disease without heart failure: Secondary | ICD-10-CM | POA: Diagnosis not present

## 2021-07-30 DIAGNOSIS — E785 Hyperlipidemia, unspecified: Secondary | ICD-10-CM | POA: Diagnosis not present

## 2021-07-30 DIAGNOSIS — E1165 Type 2 diabetes mellitus with hyperglycemia: Secondary | ICD-10-CM | POA: Diagnosis not present

## 2021-08-08 DIAGNOSIS — N3001 Acute cystitis with hematuria: Secondary | ICD-10-CM | POA: Diagnosis not present

## 2021-08-12 ENCOUNTER — Encounter: Payer: Self-pay | Admitting: Cardiology

## 2021-08-12 ENCOUNTER — Other Ambulatory Visit: Payer: Self-pay

## 2021-08-12 ENCOUNTER — Ambulatory Visit: Payer: Medicare HMO | Admitting: Cardiology

## 2021-08-12 VITALS — BP 128/64 | HR 90 | Ht 62.0 in | Wt 156.4 lb

## 2021-08-12 DIAGNOSIS — E1122 Type 2 diabetes mellitus with diabetic chronic kidney disease: Secondary | ICD-10-CM | POA: Diagnosis not present

## 2021-08-12 DIAGNOSIS — R011 Cardiac murmur, unspecified: Secondary | ICD-10-CM | POA: Diagnosis not present

## 2021-08-12 DIAGNOSIS — E7800 Pure hypercholesterolemia, unspecified: Secondary | ICD-10-CM

## 2021-08-12 DIAGNOSIS — N183 Chronic kidney disease, stage 3 unspecified: Secondary | ICD-10-CM

## 2021-08-12 DIAGNOSIS — I10 Essential (primary) hypertension: Secondary | ICD-10-CM | POA: Diagnosis not present

## 2021-08-12 NOTE — Patient Instructions (Addendum)
Medication Instructions:  Your physician recommends that you continue on your current medications as directed. Please refer to the Current Medication list given to you today.  *If you need a refill on your cardiac medications before your next appointment, please call your pharmacy*   Lab Work: None ordered If you have labs (blood work) drawn today and your tests are completely normal, you will receive your results only by: [facility] (if you have MyChart) OR A paper copy in the mail If you have any lab test that is abnormal or we need to change your treatment, we will call you to review the results.   Testing/Procedures: Your physician has requested that you have an echocardiogram. Echocardiography is a painless test that uses sound waves to create images of your heart. It provides your doctor with information about the size and shape of your heart and how well your hearts chambers and valves are working. This procedure takes approximately one hour. There are no restrictions for this procedure.    Follow-Up: At [facility], you and your health needs are our priority.  As part of our continuing mission to provide you with exceptional heart care, we have created designated Provider Care Teams.  These Care Teams include your primary Cardiologist (physician) and Advanced Practice Providers (APPs -  Physician Assistants and Nurse Practitioners) who all work together to provide you with the care you need, when you need it.  We recommend signing up for the patient portal called "MyChart".  Sign up information is provided on this After Visit Summary.  MyChart is used to connect with patients for Virtual Visits (Telemedicine).  Patients are able to view lab/test results, encounter notes, upcoming appointments, etc.  Non-urgent messages can be sent to your provider as well.   To learn more about what you can do with MyChart, go to [email or web address].    Your next appointment:   12  month(s)  The format for your next appointment:   In Person  Provider:   Jyl Heinz, MD   Other Instructions Echocardiogram An echocardiogram is a test that uses sound waves (ultrasound) to produce images of the heart. Images from an echocardiogram can provide important information about: Heart size and shape. The size and thickness and movement of your heart's walls. Heart muscle function and strength. Heart valve function or if you have stenosis. Stenosis is when the heart valves are too narrow. If blood is flowing backward through the heart valves (regurgitation). A tumor or infectious growth around the heart valves. Areas of heart muscle that are not working well because of poor blood flow or injury from a heart attack. Aneurysm detection. An aneurysm is a weak or damaged part of an artery wall. The wall bulges out from the normal force of blood pumping through the body. Tell a health care provider about: Any allergies you have. All medicines you are taking, including vitamins, herbs, eye drops, creams, and over-the-counter medicines. Any blood disorders you have. Any surgeries you have had. Any medical conditions you have. Whether you are pregnant or may be pregnant. What are the risks? Generally, this is a safe test. However, problems may occur, including an allergic reaction to dye (contrast) that may be used during the test. What happens before the test? No specific preparation is needed. You may eat and drink normally. What happens during the test?  You will take off your clothes from the waist up and put on a hospital gown. Electrodes or electrocardiogram (ECG)patches may be placed  on your chest. The electrodes or patches are then connected to a device that monitors your heart rate and rhythm. You will lie down on a table for an ultrasound exam. A gel will be applied to your chest to help sound waves pass through your skin. A handheld device, called a transducer,  will be pressed against your chest and moved over your heart. The transducer produces sound waves that travel to your heart and bounce back (or "echo" back) to the transducer. These sound waves will be captured in real-time and changed into images of your heart that can be viewed on a video monitor. The images will be recorded on a computer and reviewed by your health care provider. You may be asked to change positions or hold your breath for a short time. This makes it easier to get different views or better views of your heart. In some cases, you may receive contrast through an IV in one of your veins. This can improve the quality of the pictures from your heart. The procedure may vary among health care providers and hospitals. What can I expect after the test? You may return to your normal, everyday life, including diet, activities, and medicines, unless your health care provider tells you not to do that. Follow these instructions at home: It is up to you to get the results of your test. Ask your health care provider, or the department that is doing the test, when your results will be ready. Keep all follow-up visits. This is important. Summary An echocardiogram is a test that uses sound waves (ultrasound) to produce images of the heart. Images from an echocardiogram can provide important information about the size and shape of your heart, heart muscle function, heart valve function, and other possible heart problems. You do not need to do anything to prepare before this test. You may eat and drink normally. After the echocardiogram is completed, you may return to your normal, everyday life, unless your health care provider tells you not to do that. This information is not intended to replace advice given to you by your health care provider. Make sure you discuss any questions you have with your health care provider. Document Revised: 02/27/2021 Document Reviewed: 02/07/2020 Elsevier Patient  Education  2022 [employer].

## 2021-08-12 NOTE — Progress Notes (Signed)
Cardiology Office Note:    Date:  08/12/2021   ID:  Carol Torres, DOB [birth year], MRN [id number]  PCP:  Serita Grammes, MD  Cardiologist:  Jenean Lindau, MD   Referring MD: Serita Grammes, MD    ASSESSMENT:    1. Primary hypertension   2. Hypercholesterolemia   3. Type 2 diabetes mellitus with stage 3 chronic kidney disease, without long-term current use of insulin, unspecified whether stage 3a or 3b CKD ([place])   4. Murmur   5. Cardiac murmur    PLAN:    In order of problems listed above:  Primary prevention stressed to the patient.  Importance of compliance with diet and medications/anticoagulation understanding. Essential hypertension: Blood pressure stable and diet was emphasized.  Lifestyle modifications encouraged. Diabetes mellitus: Managed by primary care. Mixed dyslipidemia: Patient on statin therapy.  LDL must be less than 70 and this is managed by her primary care.  Diet was emphasized. History of congestive heart failure: I am unable to document as to if this history is obtained above.  Patient does not give any such history.  In view of this I will do a cardiac echocardiogram to assess left ventricular systolic function and cardiac anatomy.  Further recommendations will be made based on the findings of the study.  With activities of daily living patient does not experience any significant symptoms.  She is happy with her overall self.  I do not see any value for any further testing such as stress testing or such in view of the fact that she has no significant symptoms.  I discussed this with her and she is not too keen on any further testing.  I will see her in follow-up on an annual basis.   Medication Adjustments/Labs and Tests Ordered: Current medicines are reviewed at length with the patient today.  Concerns regarding medicines are outlined above.  Orders Placed This Encounter  Procedures   EKG 12-Lead   ECHOCARDIOGRAM COMPLETE   No orders of the  defined types were placed in this encounter.    History of Present Illness:    Carol Torres is a 76 y.o. female who is being seen today for the evaluation of congestive heart failure at the request of Serita Grammes, MD. patient is a pleasant 76 year old female.  She has past medical history of essential hypertension, dyslipidemia, diabetes mellitus.  She ambulates with a cane.  She has been referred because of multiple risk factors for coronary artery disease.  There is also history of congestive heart failure.  Patient denies any chest pain orthopnea or PND.  She is not clear about why she is here today.  She denies any shortness of breath with activities of daily living.  She leads a fairly sedentary lifestyle because of orthopedic issues.  At the time of my evaluation, the patient is alert awake oriented and in no distress.  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   aspirin 81 MG EC tablet Take 81 mg by mouth daily.   cefdinir (OMNICEF) 300 MG capsule Take 300 mg by mouth 2 (two) times daily.   cetirizine (ZYRTEC) 10 MG tablet Take 10 mg by mouth at bedtime.   cholecalciferol (VITAMIN D3) 25 MCG (1000 UNIT) tablet Take 1,000 Units by mouth daily.   dapagliflozin propanediol (FARXIGA) 10 MG TABS tablet Take 10 mg by mouth daily.   Dulaglutide (TRULICITY) 1.5 BS/9.6GE SOPN Inject 1.5 mg into the skin once a week.   fluticasone (FLONASE) 50  MCG/ACT nasal spray 1 spray by Each Nare route 2 times daily.   furosemide (LASIX) 20 MG tablet Take 20 mg by mouth daily.   hydrochlorothiazide (HYDRODIURIL) 12.5 MG tablet Take 12.5 mg by mouth every morning.   latanoprost (XALATAN) 0.005 % ophthalmic solution Place 1 drop into both eyes at bedtime.   LORazepam (ATIVAN) 0.5 MG tablet Take 0.5 mg by mouth as needed (prior to procedure).   metFORMIN (GLUCOPHAGE) 1000 MG tablet Take 1,000 mg by mouth 2 (two) times daily.   metoprolol tartrate (LOPRESSOR) 50 MG tablet Take 50 mg by mouth every morning.   montelukast (SINGULAIR) 10 MG tablet Take 10 mg by mouth at bedtime.   ondansetron (ZOFRAN-ODT) 8 MG disintegrating tablet Take 8 mg by mouth 3 (three) times daily as needed for nausea or vomiting.   rosuvastatin (CRESTOR) 10 MG tablet Take 10 mg by mouth 2 (two) times a week.   telmisartan (MICARDIS) 40 MG tablet Take 40 mg by mouth every morning.     Allergies:   Voltaren [diclofenac]   Social History   Socioeconomic History   Marital status: Widowed    Spouse name: Not on file   Number of children: Not on file   Years of education: Not on file   Highest education level: Not on file  Occupational History   Not on file  Tobacco Use   Smoking status: Unknown   Smokeless tobacco: Never  Substance and Sexual Activity   Alcohol use: Not on file   Drug use: Not on file   Sexual activity: Not on file  Other Topics Concern   Not on file  Social History Narrative   Not on file   Social Determinants of Health   Financial Resource Strain: Not on file  Food Insecurity: Not on file  Transportation Needs: Not on file  Physical Activity: Not on file  Stress: Not on file  Social Connections: Not on file     Family History: The patient's family history includes Heart attack in her father and mother; Heart disease in her brother and brother.  ROS:   Please see the history of present illness.    All other systems reviewed and are  negative.  EKGs/Labs/Other Studies Reviewed:    The following studies were reviewed today: EKG reveals sinus rhythm and nonspecific ST-T changes   Recent Labs: No results found for requested labs within last 8760 hours.  Recent Lipid Panel No results found for: CHOL, TRIG, HDL, CHOLHDL, VLDL, LDLCALC, LDLDIRECT  Physical Exam:    VS:  BP 128/64    Pulse 90    Ht 5\' 2"  (1.575 m)    Wt 156 lb 6.4 oz (70.9 kg)    SpO2 96%    BMI 28.61 kg/m     Wt Readings from Last 3 Encounters:  08/12/21 156 lb 6.4 oz (70.9 kg)  GEN: Patient is in no acute distress HEENT: Normal NECK: No JVD; No carotid bruits LYMPHATICS: No lymphadenopathy CARDIAC: S1 S2 regular, 2/6 systolic murmur at the apex. RESPIRATORY:  Clear to auscultation without rales, wheezing or rhonchi  ABDOMEN: Soft, non-tender, non-distended MUSCULOSKELETAL:  No edema; No deformity  SKIN: Warm and dry NEUROLOGIC:  Alert and oriented x 3 PSYCHIATRIC:  Normal affect    Signed, Jenean Lindau, MD  08/12/2021 1:57 PM    [facility]

## 2021-08-14 ENCOUNTER — Ambulatory Visit (INDEPENDENT_AMBULATORY_CARE_PROVIDER_SITE_OTHER): Payer: Medicare HMO

## 2021-08-14 ENCOUNTER — Other Ambulatory Visit: Payer: Self-pay

## 2021-08-14 DIAGNOSIS — R011 Cardiac murmur, unspecified: Secondary | ICD-10-CM

## 2021-08-14 LAB — ECHOCARDIOGRAM COMPLETE
Area-P 1/2: 2.91 cm2
S' Lateral: 3.7 cm

## 2021-08-27 DIAGNOSIS — I119 Hypertensive heart disease without heart failure: Secondary | ICD-10-CM | POA: Diagnosis not present

## 2021-08-27 DIAGNOSIS — E785 Hyperlipidemia, unspecified: Secondary | ICD-10-CM | POA: Diagnosis not present

## 2021-08-27 DIAGNOSIS — E1165 Type 2 diabetes mellitus with hyperglycemia: Secondary | ICD-10-CM | POA: Diagnosis not present

## 2021-10-08 DIAGNOSIS — H2511 Age-related nuclear cataract, right eye: Secondary | ICD-10-CM | POA: Diagnosis not present

## 2021-10-08 DIAGNOSIS — H18413 Arcus senilis, bilateral: Secondary | ICD-10-CM | POA: Diagnosis not present

## 2021-10-08 DIAGNOSIS — H353131 Nonexudative age-related macular degeneration, bilateral, early dry stage: Secondary | ICD-10-CM | POA: Diagnosis not present

## 2021-10-08 DIAGNOSIS — H2513 Age-related nuclear cataract, bilateral: Secondary | ICD-10-CM | POA: Diagnosis not present

## 2021-10-08 DIAGNOSIS — H401131 Primary open-angle glaucoma, bilateral, mild stage: Secondary | ICD-10-CM | POA: Diagnosis not present

## 2021-10-21 ENCOUNTER — Ambulatory Visit: Payer: Medicare HMO | Admitting: Podiatry

## 2021-10-24 ENCOUNTER — Encounter: Payer: Self-pay | Admitting: Podiatry

## 2021-10-24 ENCOUNTER — Ambulatory Visit (INDEPENDENT_AMBULATORY_CARE_PROVIDER_SITE_OTHER): Payer: Medicare HMO | Admitting: Podiatry

## 2021-10-24 DIAGNOSIS — M79675 Pain in left toe(s): Secondary | ICD-10-CM

## 2021-10-24 DIAGNOSIS — L84 Corns and callosities: Secondary | ICD-10-CM

## 2021-10-24 DIAGNOSIS — Q828 Other specified congenital malformations of skin: Secondary | ICD-10-CM | POA: Diagnosis not present

## 2021-10-24 DIAGNOSIS — M2041 Other hammer toe(s) (acquired), right foot: Secondary | ICD-10-CM

## 2021-10-24 DIAGNOSIS — M2042 Other hammer toe(s) (acquired), left foot: Secondary | ICD-10-CM | POA: Diagnosis not present

## 2021-10-24 DIAGNOSIS — B351 Tinea unguium: Secondary | ICD-10-CM

## 2021-10-24 DIAGNOSIS — E1151 Type 2 diabetes mellitus with diabetic peripheral angiopathy without gangrene: Secondary | ICD-10-CM | POA: Diagnosis not present

## 2021-10-24 DIAGNOSIS — M79674 Pain in right toe(s): Secondary | ICD-10-CM | POA: Diagnosis not present

## 2021-10-24 DIAGNOSIS — E119 Type 2 diabetes mellitus without complications: Secondary | ICD-10-CM

## 2021-10-24 NOTE — Patient Instructions (Signed)
It was my pleasure serving you today! You had your annual diabetic foot examination performed today. You will notice a charge for an office visit on today's billing and this is for your annual diabetic foot exam. It is done once yearly.  ? ?Diabetes Mellitus and Foot Care ?Foot care is an important part of your health, especially when you have diabetes. Diabetes may cause you to have problems because of poor blood flow (circulation) to your feet and legs, which can cause your skin to: ?Become thinner and drier. ?Break more easily. ?Heal more slowly. ?Peel and crack. ?You may also have nerve damage (neuropathy) in your legs and feet, causing decreased feeling in them. This means that you may not notice minor injuries to your feet that could lead to more serious problems. Noticing and addressing any potential problems early is the best way to prevent future foot problems. ?How to care for your feet ?Foot hygiene ? ?Wash your feet daily with warm water and mild soap. Do not use hot water. Then, pat your feet and the areas between your toes until they are completely dry. Do not soak your feet as this can dry your skin. ?Trim your toenails straight across. Do not dig under them or around the cuticle. File the edges of your nails with an emery board or nail file. ?Apply a moisturizing lotion or petroleum jelly to the skin on your feet and to dry, brittle toenails. Use lotion that does not contain alcohol and is unscented. Do not apply lotion between your toes. ?Shoes and socks ?Wear clean socks or stockings every day. Make sure they are not too tight. Do not wear knee-high stockings since they may decrease blood flow to your legs. ?Wear shoes that fit properly and have enough cushioning. Always look in your shoes before you put them on to be sure there are no objects inside. ?To break in new shoes, wear them for just a few hours a day. This prevents injuries on your feet. ?Wounds, scrapes, corns, and calluses ? ?Check  your feet daily for blisters, cuts, bruises, sores, and redness. If you cannot see the bottom of your feet, use a mirror or ask someone for help. ?Do not cut corns or calluses or try to remove them with medicine. ?If you find a minor scrape, cut, or break in the skin on your feet, keep it and the skin around it clean and dry. You may clean these areas with mild soap and water. Do not clean the area with peroxide, alcohol, or iodine. ?If you have a wound, scrape, corn, or callus on your foot, look at it several times a day to make sure it is healing and not infected. Check for: ?Redness, swelling, or pain. ?Fluid or blood. ?Warmth. ?Pus or a bad smell. ?General tips ?Do not cross your legs. This may decrease blood flow to your feet. ?Do not use heating pads or hot water bottles on your feet. They may burn your skin. If you have lost feeling in your feet or legs, you may not know this is happening until it is too late. ?Protect your feet from hot and cold by wearing shoes, such as at the beach or on hot pavement. ?Schedule a complete foot exam at least once a year (annually) or more often if you have foot problems. Report any cuts, sores, or bruises to your health care provider immediately. ?Where to find more information ?American Diabetes Association: www.diabetes.org ?Association of Diabetes Care & Education Specialists: www.diabeteseducator.org ?Contact  a health care provider if: ?You have a medical condition that increases your risk of infection and you have any cuts, sores, or bruises on your feet. ?You have an injury that is not healing. ?You have redness on your legs or feet. ?You feel burning or tingling in your legs or feet. ?You have pain or cramps in your legs and feet. ?Your legs or feet are numb. ?Your feet always feel cold. ?You have pain around any toenails. ?Get help right away if: ?You have a wound, scrape, corn, or callus on your foot and: ?You have pain, swelling, or redness that gets worse. ?You  have fluid or blood coming from the wound, scrape, corn, or callus. ?Your wound, scrape, corn, or callus feels warm to the touch. ?You have pus or a bad smell coming from the wound, scrape, corn, or callus. ?You have a fever. ?You have a red line going up your leg. ?Summary ?Check your feet every day for blisters, cuts, bruises, sores, and redness. ?Apply a moisturizing lotion or petroleum jelly to the skin on your feet and to dry, brittle toenails. ?Wear shoes that fit properly and have enough cushioning. ?If you have foot problems, report any cuts, sores, or bruises to your health care provider immediately. ?Schedule a complete foot exam at least once a year (annually) or more often if you have foot problems. ?This information is not intended to replace advice given to you by your health care provider. Make sure you discuss any questions you have with your health care provider. ?Document Revised: 01/05/2020 Document Reviewed: 01/05/2020 ?Elsevier Patient Education ? [place]. ? ?

## 2021-11-03 ENCOUNTER — Encounter: Payer: Self-pay | Admitting: Podiatry

## 2021-11-03 NOTE — Progress Notes (Signed)
ANNUAL DIABETIC FOOT EXAM ? ?Subjective: ?HONESTEE REVARD presents today for annual diabetic foot examination. ? ?Patient relates 20 year h/o diabetes. ? ?Patient denies any h/o foot wounds. ? ?Patient admits symptoms of foot numbness on occasion. ? ?Patient's blood sugar was 130 mg/dl today. Last known  HgA1c was 6.3%  ? ?Risk factors: diabetes, HTN, CKD, hypercholesterolemia, current tobacco user. ? ?Serita Grammes, MD is patient's PCP. Last visit was July 16, 2021. ? ?Past Medical History:  ?Diagnosis Date  ? 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 Diverticulosis   ? Hypercholesterolemia   ? Hypertension   ? 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 Sciatica   ? Type 2 diabetes mellitus ([place])   ? 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 ?Patient Active Problem List  ? Diagnosis Date Noted  ? Cardiac murmur 08/12/2021  ? Diverticulosis 06/19/2021  ? Hypercholesterolemia 06/19/2021  ? Hypertension 06/19/2021  ? 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 Sciatica 06/19/2021  ? Type 2 diabetes mellitus ([place]) 06/19/2021  ? 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 ?Past Surgical History:  ?Procedure Laterality Date  ? REPLACEMENT TOTAL KNEE Right   ? ?Current Outpatient Medications on File Prior to Visit  ?Medication Sig Dispense Refill  ? albuterol (VENTOLIN HFA) 108 (90 Base) MCG/ACT inhaler Inhale 1 puff into the lungs every 6 (six) hours.    ? aspirin 81 MG EC tablet Take 81 mg by mouth daily.    ? cetirizine (ZYRTEC) 10 MG tablet Take 10 mg by mouth at bedtime.    ? cholecalciferol (VITAMIN D3) 25 MCG (1000 UNIT) tablet Take 1,000 Units by mouth daily.    ? dapagliflozin propanediol (FARXIGA) 10 MG TABS tablet Take 10 mg by mouth daily.    ? Dulaglutide (TRULICITY) 1.5 VC/9.4WH SOPN Inject 1.5 mg into the  skin once a week.    ? fluticasone (FLONASE) 50 MCG/ACT nasal spray 1 spray by Each Nare route 2 times daily.    ? furosemide (LASIX) 20 MG tablet Take 20 mg by mouth daily.    ? hydrochlorothiazide (HYDRODIURIL) 12.5 MG tablet Take 12.5 mg by mouth every morning.    ? latanoprost (XALATAN) 0.005 % ophthalmic solution Place 1 drop into both eyes at bedtime.    ? LORazepam (ATIVAN) 0.5 MG tablet Take 0.5 mg by mouth as needed (prior to procedure).    ? metFORMIN (GLUCOPHAGE) 1000 MG tablet Take 1,000 mg by mouth 2 (two) times daily.    ? metoprolol tartrate (LOPRESSOR) 50 MG tablet Take 50 mg by mouth every morning.    ? montelukast (SINGULAIR) 10 MG tablet Take 10 mg by mouth at bedtime.    ? ondansetron (ZOFRAN-ODT) 8 MG disintegrating tablet Take 8 mg by mouth 3 (three) times daily as needed for nausea or vomiting.    ? rosuvastatin (CRESTOR) 10 MG tablet Take 10 mg by mouth 2 (two) times a week.    ? telmisartan (MICARDIS) 40 MG tablet Take 40 mg by mouth every morning.    ? ?No current facility-administered medications on file prior to visit.  ?  ?Allergies  ?Allergen Reactions  ? Voltaren [Diclofenac] Other (See Comments)  ?  Severe stomach cramps  ? ?Social History  ? ?Occupational History  ? Not  on file  ?Tobacco Use  ? Smoking status: Every Day  ?  Types: Cigarettes  ? Smokeless tobacco: Never  ?Substance and Sexual Activity  ? Alcohol use: Not on file  ? Drug use: Not on file  ? Sexual activity: Not on file  ? ?Family History  ?Problem Relation Age of Onset  ? Heart attack Mother   ? Heart attack Father   ? Heart disease Brother   ? Heart disease Brother   ? ?Immunization History  ?Administered Date(s) Administered  ? Influenza-Unspecified 04/30/2013  ?  ? ?Review of Systems: Negative except as noted in the HPI.  ? ?Objective: ?There were no vitals filed for this visit. ? ?ANELISE STARON is a pleasant 76 y.o. female in NAD. AAO X 3. ? ?Vascular Examination: ?CFT <3 seconds b/l LE. Faintly palpable DP  pulses b/l LE. Faintly palpable PT pulse(s) b/l LE. Pedal hair absent. No pain with calf compression b/l. Lower extremity skin temperature gradient within normal limits. No edema noted b/l LE. No cyanosis or clubbing noted b/l LE. ? ?Dermatological Examination: ?Pedal skin thin, shiny and atrophic b/l LE. No open wounds b/l LE. No interdigital macerations noted b/l LE. Toenails 1-5 b/l elongated, discolored, dystrophic, thickened, crumbly with subungual debris and tenderness to dorsal palpation. Hyperkeratotic lesion(s) submet head 5 left foot.  No erythema, no edema, no drainage, no fluctuance. Porokeratotic lesion(s) sulcus of left foot and submet head 2 right foot. No erythema, no edema, no drainage, no fluctuance. ? ?Neurological Examination: ?Protective sensation intact 5/5 intact bilaterally with 10g monofilament b/l. Vibratory sensation intact b/l. ? ?Musculoskeletal Examination: ?Muscle strength 5/5 to all lower extremity muscle groups bilaterally. Hammertoe(s) noted to the L 3rd toe and L 5th toe. Utilizes cane for ambulation assistance. ? ?Footwear Assessment: ?Does the patient wear appropriate shoes? Yes. ?Does the patient need inserts/orthotics? Yes. ? ?Assessment: ?1. Pain due to onychomycosis of toenails of both feet   ?2. Callus   ?3. Porokeratosis   ?4. Acquired hammertoes of both feet   ?5. Diabetes mellitus type 2 with peripheral artery disease ([place])   ?6. Encounter for diabetic foot exam ([place])   ?ADA Risk Categorization: ?Low Risk :  ?Patient has all of the following: ?Intact protective sensation ?No prior foot ulcer  ?No severe deformity ?Pedal pulses present ? ?Plan: ?-Patient was evaluated and treated. All patient's and/or POA's questions/concerns answered on today's visit. ?-Diabetic foot examination performed today. ?-Continue foot and shoe inspections daily. Monitor blood glucose per PCP/Endocrinologist's recommendations. ?-Toenails 1-5 b/l were debrided in length and girth with sterile nail  nippers and dremel without iatrogenic bleeding.  ?-Callus(es) sulcus area left foot and submet head 5 left foot pared utilizing sterile scalpel blade without complication or incident. Total number debrided =2. ?-Porokeratotic lesion(s) submet head 2 right foot pared and enucleated with sterile scalpel blade. Pinpoint bleeding addressed with Lumicain Hemostatic Solution. Triple antibiotic ointment and band-aid applied. She may remove band-aid on tomorrow and no further treatment required by patient. Total number of lesions debrided=1. ?-Patient/POA to call should there be question/concern in the interim. ?Return in about 3 months (around 01/23/2022). ? ?Marzetta Board, DPM ?

## 2021-12-02 DIAGNOSIS — H6982 Other specified disorders of Eustachian tube, left ear: Secondary | ICD-10-CM | POA: Diagnosis not present

## 2021-12-02 DIAGNOSIS — Z6827 Body mass index (BMI) 27.0-27.9, adult: Secondary | ICD-10-CM | POA: Diagnosis not present

## 2021-12-02 DIAGNOSIS — N3001 Acute cystitis with hematuria: Secondary | ICD-10-CM | POA: Diagnosis not present

## 2021-12-02 DIAGNOSIS — E1165 Type 2 diabetes mellitus with hyperglycemia: Secondary | ICD-10-CM | POA: Diagnosis not present

## 2021-12-02 DIAGNOSIS — R42 Dizziness and giddiness: Secondary | ICD-10-CM | POA: Diagnosis not present

## 2021-12-04 DIAGNOSIS — H6982 Other specified disorders of Eustachian tube, left ear: Secondary | ICD-10-CM | POA: Diagnosis not present

## 2021-12-04 DIAGNOSIS — I951 Orthostatic hypotension: Secondary | ICD-10-CM | POA: Diagnosis not present

## 2021-12-04 DIAGNOSIS — N3001 Acute cystitis with hematuria: Secondary | ICD-10-CM | POA: Diagnosis not present

## 2021-12-04 DIAGNOSIS — Z6827 Body mass index (BMI) 27.0-27.9, adult: Secondary | ICD-10-CM | POA: Diagnosis not present

## 2021-12-12 DIAGNOSIS — N3001 Acute cystitis with hematuria: Secondary | ICD-10-CM | POA: Diagnosis not present

## 2021-12-12 DIAGNOSIS — Z6827 Body mass index (BMI) 27.0-27.9, adult: Secondary | ICD-10-CM | POA: Diagnosis not present

## 2021-12-12 DIAGNOSIS — H6982 Other specified disorders of Eustachian tube, left ear: Secondary | ICD-10-CM | POA: Diagnosis not present

## 2021-12-12 DIAGNOSIS — I951 Orthostatic hypotension: Secondary | ICD-10-CM | POA: Diagnosis not present

## 2022-01-07 DIAGNOSIS — Z6826 Body mass index (BMI) 26.0-26.9, adult: Secondary | ICD-10-CM | POA: Diagnosis not present

## 2022-01-07 DIAGNOSIS — N3001 Acute cystitis with hematuria: Secondary | ICD-10-CM | POA: Diagnosis not present

## 2022-01-07 DIAGNOSIS — E1165 Type 2 diabetes mellitus with hyperglycemia: Secondary | ICD-10-CM | POA: Diagnosis not present

## 2022-01-07 DIAGNOSIS — N1832 Chronic kidney disease, stage 3b: Secondary | ICD-10-CM | POA: Diagnosis not present

## 2022-01-07 DIAGNOSIS — I119 Hypertensive heart disease without heart failure: Secondary | ICD-10-CM | POA: Diagnosis not present

## 2022-01-07 DIAGNOSIS — J42 Unspecified chronic bronchitis: Secondary | ICD-10-CM | POA: Diagnosis not present

## 2022-01-13 DIAGNOSIS — H2511 Age-related nuclear cataract, right eye: Secondary | ICD-10-CM | POA: Diagnosis not present

## 2022-01-13 DIAGNOSIS — H25811 Combined forms of age-related cataract, right eye: Secondary | ICD-10-CM | POA: Diagnosis not present

## 2022-01-14 DIAGNOSIS — H2512 Age-related nuclear cataract, left eye: Secondary | ICD-10-CM | POA: Diagnosis not present

## 2022-01-30 ENCOUNTER — Ambulatory Visit: Payer: Medicare HMO | Admitting: Podiatry

## 2022-01-30 ENCOUNTER — Encounter: Payer: Self-pay | Admitting: Podiatry

## 2022-01-30 DIAGNOSIS — L84 Corns and callosities: Secondary | ICD-10-CM

## 2022-01-30 DIAGNOSIS — E1151 Type 2 diabetes mellitus with diabetic peripheral angiopathy without gangrene: Secondary | ICD-10-CM

## 2022-01-30 DIAGNOSIS — Q828 Other specified congenital malformations of skin: Secondary | ICD-10-CM | POA: Diagnosis not present

## 2022-01-30 DIAGNOSIS — B351 Tinea unguium: Secondary | ICD-10-CM

## 2022-01-30 DIAGNOSIS — M79675 Pain in left toe(s): Secondary | ICD-10-CM | POA: Diagnosis not present

## 2022-01-30 DIAGNOSIS — M79674 Pain in right toe(s): Secondary | ICD-10-CM

## 2022-01-30 NOTE — Progress Notes (Signed)
  Subjective:  Patient ID: Carol Torres, female    DOB: Dec 08, 1945,  MRN: [id number]  Carol Torres presents to clinic today for at risk foot care. Pt has h/o NIDDM with PAD and painful porokeratotic lesion(s) b/l lower extremities and painful mycotic toenails that limit ambulation. Painful toenails interfere with ambulation. Aggravating factors include wearing enclosed shoe gear. Pain is relieved with periodic professional debridement. Painful porokeratotic lesions are aggravated when weightbearing with and without shoegear. Pain is relieved with periodic professional debridement.   Last A1c was 5.6%.  Patient did not check blood glucose today.  She states she will be having cataract surgery soon.  New problem(s): None.   PCP is Serita Grammes, MD , and last visit was  December 12, 2021  Allergies  Allergen Reactions   Voltaren [Diclofenac] Other (See Comments)    Severe stomach cramps    Review of Systems: Negative except as noted in the HPI.  Objective: No changes noted in today's physical examination. There were no vitals filed for this visit.  Carol Torres is a pleasant 76 y.o. female in NAD. AAO X 3.  Vascular Examination: CFT <3 seconds b/l LE. Faintly palpable DP pulses b/l LE. Faintly palpable PT pulse(s) b/l LE. Pedal hair absent. No pain with calf compression b/l. Lower extremity skin temperature gradient within normal limits. No edema noted b/l LE. No cyanosis or clubbing noted b/l LE.  Dermatological Examination: Pedal skin thin, shiny and atrophic b/l LE. No open wounds b/l LE. No interdigital macerations noted b/l LE. Toenails 1-5 b/l elongated, discolored, dystrophic, thickened, crumbly with subungual debris and tenderness to dorsal palpation. Hyperkeratotic lesion(s) submet head 5 left foot.  No erythema, no edema, no drainage, no fluctuance. Porokeratotic lesion(s) sulcus of left foot and submet head 2 right foot. No erythema, no edema, no drainage, no  fluctuance.  Neurological Examination: Protective sensation intact 5/5 intact bilaterally with 10g monofilament b/l. Vibratory sensation intact b/l.  Musculoskeletal Examination: Muscle strength 5/5 to all lower extremity muscle groups bilaterally. Hammertoe(s) noted to the L 3rd toe and L 5th toe. Utilizes cane for ambulation assistance.  Assessment/Plan: 1. Pain due to onychomycosis of toenails of both feet   2. Callus   3. Porokeratosis   4. Diabetes mellitus type 2 with peripheral artery disease ([place])      -Examined patient. -No new findings. No new orders. -Patient to continue soft, supportive shoe gear daily. -Mycotic toenails 1-5 bilaterally were debrided in length and girth with sterile nail nippers and dremel without incident. -Callus(es) submet head 5 left foot pared utilizing sterile scalpel blade without complication or incident. Total number debrided =1. -Porokeratotic lesion(s) sulcus area left forefoot and submet head 2 right foot pared and enucleated with sterile scalpel blade without incident. Total number of lesions debrided=2. Pinpoint bleeding right foot lesion addressed with Lumicain Hemostatic Solution. Silvadene Cream and band-aid applied. Patient instructed to apply Neosporin to right foot once daily for one week. Call office if she has any problems. -Patient/POA to call should there be question/concern in the interim.   Return in about 3 months (around 05/02/2022).  Marzetta Board, DPM

## 2022-02-03 DIAGNOSIS — H25812 Combined forms of age-related cataract, left eye: Secondary | ICD-10-CM | POA: Diagnosis not present

## 2022-02-03 DIAGNOSIS — H2512 Age-related nuclear cataract, left eye: Secondary | ICD-10-CM | POA: Diagnosis not present

## 2022-02-27 DIAGNOSIS — H31092 Other chorioretinal scars, left eye: Secondary | ICD-10-CM | POA: Diagnosis not present

## 2022-02-27 DIAGNOSIS — H35033 Hypertensive retinopathy, bilateral: Secondary | ICD-10-CM | POA: Diagnosis not present

## 2022-02-27 DIAGNOSIS — E1165 Type 2 diabetes mellitus with hyperglycemia: Secondary | ICD-10-CM | POA: Diagnosis not present

## 2022-02-27 DIAGNOSIS — E785 Hyperlipidemia, unspecified: Secondary | ICD-10-CM | POA: Diagnosis not present

## 2022-02-27 DIAGNOSIS — E119 Type 2 diabetes mellitus without complications: Secondary | ICD-10-CM | POA: Diagnosis not present

## 2022-02-27 DIAGNOSIS — I119 Hypertensive heart disease without heart failure: Secondary | ICD-10-CM | POA: Diagnosis not present

## 2022-02-27 DIAGNOSIS — H353132 Nonexudative age-related macular degeneration, bilateral, intermediate dry stage: Secondary | ICD-10-CM | POA: Diagnosis not present

## 2022-03-27 DIAGNOSIS — H353132 Nonexudative age-related macular degeneration, bilateral, intermediate dry stage: Secondary | ICD-10-CM | POA: Diagnosis not present

## 2022-03-27 DIAGNOSIS — H43821 Vitreomacular adhesion, right eye: Secondary | ICD-10-CM | POA: Diagnosis not present

## 2022-03-27 DIAGNOSIS — H35033 Hypertensive retinopathy, bilateral: Secondary | ICD-10-CM | POA: Diagnosis not present

## 2022-03-27 DIAGNOSIS — H34812 Central retinal vein occlusion, left eye, with macular edema: Secondary | ICD-10-CM | POA: Diagnosis not present

## 2022-03-27 DIAGNOSIS — D3131 Benign neoplasm of right choroid: Secondary | ICD-10-CM | POA: Diagnosis not present

## 2022-04-17 DIAGNOSIS — Z0100 Encounter for examination of eyes and vision without abnormal findings: Secondary | ICD-10-CM | POA: Diagnosis not present

## 2022-05-01 DIAGNOSIS — Z23 Encounter for immunization: Secondary | ICD-10-CM | POA: Diagnosis not present

## 2022-05-08 DIAGNOSIS — H35372 Puckering of macula, left eye: Secondary | ICD-10-CM | POA: Diagnosis not present

## 2022-05-08 DIAGNOSIS — H353132 Nonexudative age-related macular degeneration, bilateral, intermediate dry stage: Secondary | ICD-10-CM | POA: Diagnosis not present

## 2022-05-08 DIAGNOSIS — H35033 Hypertensive retinopathy, bilateral: Secondary | ICD-10-CM | POA: Diagnosis not present

## 2022-05-08 DIAGNOSIS — H34812 Central retinal vein occlusion, left eye, with macular edema: Secondary | ICD-10-CM | POA: Diagnosis not present

## 2022-05-08 DIAGNOSIS — D3131 Benign neoplasm of right choroid: Secondary | ICD-10-CM | POA: Diagnosis not present

## 2022-05-15 ENCOUNTER — Encounter: Payer: Self-pay | Admitting: Podiatry

## 2022-05-15 ENCOUNTER — Ambulatory Visit: Payer: Medicare HMO | Admitting: Podiatry

## 2022-05-15 DIAGNOSIS — M79674 Pain in right toe(s): Secondary | ICD-10-CM

## 2022-05-15 DIAGNOSIS — L84 Corns and callosities: Secondary | ICD-10-CM

## 2022-05-15 DIAGNOSIS — B351 Tinea unguium: Secondary | ICD-10-CM | POA: Diagnosis not present

## 2022-05-15 DIAGNOSIS — E1151 Type 2 diabetes mellitus with diabetic peripheral angiopathy without gangrene: Secondary | ICD-10-CM | POA: Diagnosis not present

## 2022-05-15 DIAGNOSIS — M79675 Pain in left toe(s): Secondary | ICD-10-CM | POA: Diagnosis not present

## 2022-05-15 DIAGNOSIS — Q828 Other specified congenital malformations of skin: Secondary | ICD-10-CM | POA: Diagnosis not present

## 2022-05-15 NOTE — Progress Notes (Signed)
  Subjective:  Patient ID: Carol Torres, female    DOB: 1945/11/04,  MRN: [id number]  [facility] presents to clinic today for at risk foot care. Pt has h/o NIDDM with PAD and callus(es) b/l lower extremities and painful thick toenails that are difficult to trim. Painful toenails interfere with ambulation. Aggravating factors include wearing enclosed shoe gear. Pain is relieved with periodic professional debridement. Painful calluses are aggravated when weightbearing with and without shoegear. Pain is relieved with periodic professional debridement.  Chief Complaint  Patient presents with   Nail Problem    Diabetic foot care BS-130 A1C-6.1 PCP-[place] PCP VST-4 months   New problem(s): None.   PCP is Serita Grammes, MD .  Allergies  Allergen Reactions   Voltaren [Diclofenac] Other (See Comments)    Severe stomach cramps    Review of Systems: Negative except as noted in the HPI.  Objective: No changes noted in today's physical examination.  Carol Torres is a pleasant 76 y.o. female WD, WN in NAD. AAO x 3.  Vascular Examination: CFT <3 seconds b/l LE. Faintly palpable DP pulses b/l LE. Faintly palpable PT pulse(s) b/l LE. Pedal hair absent. No pain with calf compression b/l. Lower extremity skin temperature gradient within normal limits. No edema noted b/l LE. No cyanosis or clubbing noted b/l LE.  Dermatological Examination: Pedal skin thin, shiny and atrophic b/l LE. No open wounds b/l LE. No interdigital macerations noted b/l LE. Toenails 1-5 b/l elongated, discolored, dystrophic, thickened, crumbly with subungual debris and tenderness to dorsal palpation.   Hyperkeratotic lesion(s) submet head 5 left foot.  No erythema, no edema, no drainage, no fluctuance.   Porokeratotic lesion(s) sulcus of left foot and submet head 2 right foot. No erythema, no edema, no drainage, no fluctuance.  Neurological Examination: Protective sensation intact 5/5 intact bilaterally with  10g monofilament b/l. Vibratory sensation intact b/l.  Musculoskeletal Examination: Muscle strength 5/5 to all lower extremity muscle groups bilaterally. Hammertoe(s) noted to the L 3rd toe and L 5th toe. Utilizes cane for ambulation assistance.  Assessment/Plan: 1. Pain due to onychomycosis of toenails of both feet   2. Callus   3. Porokeratosis   4. Diabetes mellitus type 2 with peripheral artery disease ([facility])     No orders of the defined types were placed in this encounter.   -Patient was evaluated and treated. All patient's and/or POA's questions/concerns answered on today's visit. -Continue supportive shoe gear daily. -Toenails 1-5 b/l were debrided in length and girth with sterile nail nippers and dremel without iatrogenic bleeding.  -Callus(es) submet head 5 left foot pared utilizing sterile scalpel blade without complication or incident. Total number debrided =1. -Porokeratotic lesion(s) sulcus area left forefoot and submet head 2 right foot pared and enucleated with sterile currette without incident. Total number of lesions debrided=2. -Patient/POA to call should there be question/concern in the interim.   Return in about 3 months (around 08/15/2022).  Marzetta Board, DPM

## 2022-06-12 DIAGNOSIS — H43823 Vitreomacular adhesion, bilateral: Secondary | ICD-10-CM | POA: Diagnosis not present

## 2022-06-12 DIAGNOSIS — H34812 Central retinal vein occlusion, left eye, with macular edema: Secondary | ICD-10-CM | POA: Diagnosis not present

## 2022-07-07 DIAGNOSIS — R0982 Postnasal drip: Secondary | ICD-10-CM | POA: Diagnosis not present

## 2022-07-07 DIAGNOSIS — Z1331 Encounter for screening for depression: Secondary | ICD-10-CM | POA: Diagnosis not present

## 2022-07-07 DIAGNOSIS — N183 Chronic kidney disease, stage 3 unspecified: Secondary | ICD-10-CM | POA: Diagnosis not present

## 2022-07-07 DIAGNOSIS — I110 Hypertensive heart disease with heart failure: Secondary | ICD-10-CM | POA: Diagnosis not present

## 2022-07-07 DIAGNOSIS — E785 Hyperlipidemia, unspecified: Secondary | ICD-10-CM | POA: Diagnosis not present

## 2022-07-07 DIAGNOSIS — R69 Illness, unspecified: Secondary | ICD-10-CM | POA: Diagnosis not present

## 2022-07-07 DIAGNOSIS — E1122 Type 2 diabetes mellitus with diabetic chronic kidney disease: Secondary | ICD-10-CM | POA: Diagnosis not present

## 2022-07-07 DIAGNOSIS — J309 Allergic rhinitis, unspecified: Secondary | ICD-10-CM | POA: Diagnosis not present

## 2022-07-07 DIAGNOSIS — N1832 Chronic kidney disease, stage 3b: Secondary | ICD-10-CM | POA: Diagnosis not present

## 2022-07-07 DIAGNOSIS — J449 Chronic obstructive pulmonary disease, unspecified: Secondary | ICD-10-CM | POA: Diagnosis not present

## 2022-07-07 DIAGNOSIS — Z6827 Body mass index (BMI) 27.0-27.9, adult: Secondary | ICD-10-CM | POA: Diagnosis not present

## 2022-07-10 DIAGNOSIS — H34812 Central retinal vein occlusion, left eye, with macular edema: Secondary | ICD-10-CM | POA: Diagnosis not present

## 2022-07-28 DIAGNOSIS — Z1231 Encounter for screening mammogram for malignant neoplasm of breast: Secondary | ICD-10-CM | POA: Diagnosis not present

## 2022-08-01 DIAGNOSIS — Z0000 Encounter for general adult medical examination without abnormal findings: Secondary | ICD-10-CM | POA: Diagnosis not present

## 2022-08-14 DIAGNOSIS — H34812 Central retinal vein occlusion, left eye, with macular edema: Secondary | ICD-10-CM | POA: Diagnosis not present

## 2022-09-11 ENCOUNTER — Ambulatory Visit: Payer: Medicare HMO | Admitting: Podiatry

## 2022-09-11 VITALS — BP 139/80 | HR 77

## 2022-09-11 DIAGNOSIS — B351 Tinea unguium: Secondary | ICD-10-CM

## 2022-09-11 DIAGNOSIS — Q828 Other specified congenital malformations of skin: Secondary | ICD-10-CM

## 2022-09-11 DIAGNOSIS — L84 Corns and callosities: Secondary | ICD-10-CM

## 2022-09-11 DIAGNOSIS — M79675 Pain in left toe(s): Secondary | ICD-10-CM | POA: Diagnosis not present

## 2022-09-11 DIAGNOSIS — E1151 Type 2 diabetes mellitus with diabetic peripheral angiopathy without gangrene: Secondary | ICD-10-CM

## 2022-09-11 DIAGNOSIS — M79674 Pain in right toe(s): Secondary | ICD-10-CM

## 2022-09-11 NOTE — Progress Notes (Signed)
  Subjective:  Patient ID: Carol Torres, female    DOB: 1946/01/25,  MRN: [id number]  Carol Torres presents to clinic today for at risk foot care. Pt has h/o NIDDM with PAD  Chief Complaint  Patient presents with   Diabetes    Diabetic foot care, A1c-6.1 BG-140, PCP last seen 2 months ago, nail callus trim    New problem(s): None.   PCP is Serita Grammes, MD.  Allergies  Allergen Reactions   Voltaren [Diclofenac] Other (See Comments)    Severe stomach cramps    Review of Systems: Negative except as noted in the HPI.  Objective: No changes noted in today's physical examination. Vitals:   09/11/22 0928  BP: 139/80  Pulse: 77   Carol Torres is a pleasant 77 y.o. female WD, WN in NAD. AAO x 3.  Vascular Examination: CFT <3 seconds b/l LE. Faintly palpable DP pulses b/l LE. Faintly palpable PT pulse(s) b/l LE. Pedal hair absent. No pain with calf compression b/l. Lower extremity skin temperature gradient within normal limits. No edema noted b/l LE. No cyanosis or clubbing noted b/l LE.  Dermatological Examination: Pedal skin thin, shiny and atrophic b/l LE. No open wounds b/l LE. No interdigital macerations noted b/l LE. Toenails 1-5 b/l elongated, discolored, dystrophic, thickened, crumbly with subungual debris and tenderness to dorsal palpation.   Hyperkeratotic lesion(s) submet head 5 left foot.  No erythema, no edema, no drainage, no fluctuance.   Porokeratotic lesion(s) sulcus of left foot and submet head 2 right foot. No erythema, no edema, no drainage, no fluctuance.  Neurological Examination: Protective sensation intact 5/5 intact bilaterally with 10g monofilament b/l. Vibratory sensation intact b/l.  Musculoskeletal Examination: Muscle strength 5/5 to all lower extremity muscle groups bilaterally. Hammertoe(s) noted to the L 3rd toe and L 5th toe. Utilizes cane for ambulation assistance.  Assessment/Plan: 1. Pain due to onychomycosis of toenails of both  feet   2. Callus   3. Porokeratosis   4. Diabetes mellitus type 2 with peripheral artery disease ([place])     -Consent given for treatment as described below: -Examined patient. -No new findings. No new orders. -Stressed the importance of good glycemic control and the detriment of not  controlling glucose levels in relation to the foot. -Patient to continue soft, supportive shoe gear daily. -Mycotic toenails 1-5 bilaterally were debrided in length and girth with sterile nail nippers and dremel without incident. -Callus(es) submet head 5 left foot pared utilizing sterile scalpel blade without complication or incident. Total number debrided =1. -Porokeratotic lesion(s) sulcus area left foot and submet head 2 right foot pared and enucleated with sterile currette without incident. Total number of lesions debrided=2. -Patient/POA to call should there be question/concern in the interim.   Return in about 3 months (around 12/12/2022).  Carol Torres, DPM

## 2022-09-14 ENCOUNTER — Encounter: Payer: Self-pay | Admitting: Podiatry

## 2022-09-18 DIAGNOSIS — H401132 Primary open-angle glaucoma, bilateral, moderate stage: Secondary | ICD-10-CM | POA: Diagnosis not present

## 2022-09-18 DIAGNOSIS — H43823 Vitreomacular adhesion, bilateral: Secondary | ICD-10-CM | POA: Diagnosis not present

## 2022-09-18 DIAGNOSIS — H34812 Central retinal vein occlusion, left eye, with macular edema: Secondary | ICD-10-CM | POA: Diagnosis not present

## 2022-10-31 DIAGNOSIS — H43823 Vitreomacular adhesion, bilateral: Secondary | ICD-10-CM | POA: Diagnosis not present

## 2022-10-31 DIAGNOSIS — H34812 Central retinal vein occlusion, left eye, with macular edema: Secondary | ICD-10-CM | POA: Diagnosis not present

## 2022-10-31 DIAGNOSIS — H401132 Primary open-angle glaucoma, bilateral, moderate stage: Secondary | ICD-10-CM | POA: Diagnosis not present

## 2022-12-17 ENCOUNTER — Ambulatory Visit: Payer: Medicare HMO | Admitting: Podiatry

## 2022-12-18 DIAGNOSIS — H401132 Primary open-angle glaucoma, bilateral, moderate stage: Secondary | ICD-10-CM | POA: Diagnosis not present

## 2022-12-18 DIAGNOSIS — H43823 Vitreomacular adhesion, bilateral: Secondary | ICD-10-CM | POA: Diagnosis not present

## 2022-12-18 DIAGNOSIS — H34812 Central retinal vein occlusion, left eye, with macular edema: Secondary | ICD-10-CM | POA: Diagnosis not present

## 2022-12-19 ENCOUNTER — Ambulatory Visit: Payer: Medicare HMO | Admitting: Podiatry

## 2022-12-24 ENCOUNTER — Ambulatory Visit: Payer: Medicare HMO | Admitting: Podiatry

## 2022-12-24 DIAGNOSIS — B351 Tinea unguium: Secondary | ICD-10-CM

## 2022-12-24 DIAGNOSIS — E119 Type 2 diabetes mellitus without complications: Secondary | ICD-10-CM | POA: Diagnosis not present

## 2022-12-24 DIAGNOSIS — L84 Corns and callosities: Secondary | ICD-10-CM | POA: Diagnosis not present

## 2022-12-24 DIAGNOSIS — E1151 Type 2 diabetes mellitus with diabetic peripheral angiopathy without gangrene: Secondary | ICD-10-CM | POA: Diagnosis not present

## 2022-12-24 NOTE — Progress Notes (Signed)
       Subjective:  Patient ID: Carol Torres, female    DOB: 04-05-1946,  MRN: [id number]  [name] presents to clinic today for:  Chief Complaint  Patient presents with   Nail Problem    Diabetic Foot Care- Nail trim  PCP- Buckner Malta, MD Last Visit- 6 months ago   Foot Problem    C/o burning and tingling sensation to right hallux. Started about 2 months ago. No known injuries.   . Patient notes nails are thick and elongated, causing pain in shoe gear when ambulating.  Also has painful plantar calluses.  Notes occasional discoloration to right hallux only, with tingling when that occurs.  Toe will turn red, then purplish-blue, then back to normal.  No pain when this occurs.   PCP is Buckner Malta, MD.  Allergies  Allergen Reactions   Voltaren [Diclofenac] Other (See Comments)    Severe stomach cramps    Review of Systems: Negative except as noted in the HPI.  Objective:  There were no vitals filed for this visit.  [name] is a pleasant 77 y.o. female in NAD. AAO x 3.  Vascular Examination: Patient has palpable DP pulse, absent PT pulse bilateral.  Delayed capillary refill bilateral toes.  Sparse digital hair bilateral.  Proximal to distal cooling WNL bilateral.  No obvious discoloration to toes today, but patient noted this is a rare occurrence.    Dermatological Examination: Interspaces are clear with no open lesions noted bilateral.  Nails are 3-1mm thick, with yellowish/brown discoloration, subungual debris and distal onycholysis x10.  There is pain with compression of nails x10.  There are hyperkeratotic lesions noted submet 4 left foot and submet 2 right foot.  No ulceration noted .  Neurological Examination: Protective sensation intact b/l LE. Vibratory sensation intact b/l LE.  Musculoskeletal Examination: Muscle strength 5/5 to all LE muscle groups b/l. Mild pain with ROM of 1st mpj right foot.  No crepitus  Patient qualifies for at-risk  foot care because of Diabetes with PVD, pain in nails .  Assessment/Plan: 1. Type II diabetes mellitus with peripheral circulatory disorder (HCC)   2. Callus of foot   3. Dermatophytosis of nail   4.     Possible Raynaud's phenomenon right hallux.  Mycotic nails x10 were sharply debrided with sterile nail nippers and power debriding burr to decrease bulk and length.  Hyperkeratotic lesions were shaved x2 with #312 blade.  Informed patient she may have a mild Raynaud's phenomenon occurring.  Since she also has some arthritic discomfort in the right first MPJ, discussed a cortisone injection which would include local anesthetic.  We may get a twofold benefit from this which would include decreased joint pain from the arthritis and possible improvement of her Raynaud's phenomenon which could help also with diagnosis.  She will hold off at this time because it is not a regular occurrence.   Return in about 3 months (around 03/26/2023) for [facility].   [name], DPM, FACFAS [facility]     [address] [place], [place] [address]                Office [phone]  Fax [phone]

## 2023-01-06 DIAGNOSIS — R7309 Other abnormal glucose: Secondary | ICD-10-CM | POA: Diagnosis not present

## 2023-01-06 DIAGNOSIS — H353220 Exudative age-related macular degeneration, left eye, stage unspecified: Secondary | ICD-10-CM | POA: Diagnosis not present

## 2023-01-06 DIAGNOSIS — N1832 Chronic kidney disease, stage 3b: Secondary | ICD-10-CM | POA: Diagnosis not present

## 2023-01-06 DIAGNOSIS — E1122 Type 2 diabetes mellitus with diabetic chronic kidney disease: Secondary | ICD-10-CM | POA: Diagnosis not present

## 2023-01-06 DIAGNOSIS — Z1331 Encounter for screening for depression: Secondary | ICD-10-CM | POA: Diagnosis not present

## 2023-01-06 DIAGNOSIS — Z6826 Body mass index (BMI) 26.0-26.9, adult: Secondary | ICD-10-CM | POA: Diagnosis not present

## 2023-01-06 DIAGNOSIS — T753XXA Motion sickness, initial encounter: Secondary | ICD-10-CM | POA: Diagnosis not present

## 2023-01-06 DIAGNOSIS — J449 Chronic obstructive pulmonary disease, unspecified: Secondary | ICD-10-CM | POA: Diagnosis not present

## 2023-01-06 DIAGNOSIS — N183 Chronic kidney disease, stage 3 unspecified: Secondary | ICD-10-CM | POA: Diagnosis not present

## 2023-01-06 DIAGNOSIS — I110 Hypertensive heart disease with heart failure: Secondary | ICD-10-CM | POA: Diagnosis not present

## 2023-02-11 ENCOUNTER — Telehealth: Payer: Self-pay | Admitting: Pharmacy Technician

## 2023-02-11 DIAGNOSIS — Z5986 Financial insecurity: Secondary | ICD-10-CM

## 2023-02-11 NOTE — Progress Notes (Signed)
[facility])                                            [facility] Quality Pharmacy Team    02/11/2023  GLORIANNA CRANNELL [birth year] [id number]  Received message from [facility] PharmD Harlon Flor that patient needs help with ordering her refills from the patient assistance companies for which she is approved.  Care coordination call placed to [employer] in regard to [facility] refill. Spoke to [place] who informs last 90 day supply shipment was sent out on 01/16/23. She informs next shipment is due to go out on 03/11/23 provided a new prescription is received at [facility].  Care coordination call number 2 placed to [facility] pharmacy, [facility]. Spoke to [place] who informs a new prescription would need to be sent to them before the shipment can be made. Last filled in April for a 69month supply.  Care coordination call #3 placed to Effie Berkshire at [facility]. Informed her that prescriptions are needed to be escribed to the pharmacies as outline above. She informs she will send in these prescriptions.  Successful outreach to patient. HIPAA verified. Informed patient that the companies were in need of prescriptions and that Teena Dunk at [facility] will take care of sending those in and once received the Trulicity would be sent out asap and the Comoros in Sept. She verbalized understanding.  [name], CPhT [place]  [facility] Office: [phone] Fax: [phone] Email: [email or web address]

## 2023-02-19 DIAGNOSIS — H34812 Central retinal vein occlusion, left eye, with macular edema: Secondary | ICD-10-CM | POA: Diagnosis not present

## 2023-02-19 DIAGNOSIS — H43823 Vitreomacular adhesion, bilateral: Secondary | ICD-10-CM | POA: Diagnosis not present

## 2023-02-19 DIAGNOSIS — H401132 Primary open-angle glaucoma, bilateral, moderate stage: Secondary | ICD-10-CM | POA: Diagnosis not present

## 2023-02-23 ENCOUNTER — Telehealth: Payer: Self-pay | Admitting: Pharmacy Technician

## 2023-02-23 DIAGNOSIS — Z5986 Financial insecurity: Secondary | ICD-10-CM

## 2023-02-23 NOTE — Progress Notes (Addendum)
[facility])                                            [facility] Quality Pharmacy Team    02/23/2023  Carol Torres 28-Aug-1945 [id number]  Incoming call from patient In regard to Trulicity order from [place].  Spoke to patient, HIPAA verified. Patient informs she still has not received her shipment. Informed patient I would call [facility] again.  Care coordination call placed to [facility] pharmacy. Spoke to Amy who informs a new prescription will need to be sent in before medication could be sent to patient.   Care coordination call placed to Teena Dunk at [facility]. Left HIPAA compliant message requesting a return call.  Successful call placed back to patient. HIPAA verified. Informed patient that the MD office needs to send in a new prescription to [facility] and that will have to happen before she can get her medication. Informed patient a message was left with MD office to call me so I could relay this information again. Patient verbalized understanding.  [name], CPhT   [facility] Office: [phone] Fax: [phone] Email: [email or web address]

## 2023-02-26 ENCOUNTER — Telehealth: Payer: Self-pay | Admitting: Pharmacy Technician

## 2023-02-26 DIAGNOSIS — Z5986 Financial insecurity: Secondary | ICD-10-CM

## 2023-02-26 NOTE — Progress Notes (Signed)
[facility] service [facility])                                            [facility] Quality Pharmacy Team    02/26/2023  Carol Torres 25-Jun-1946 [id number]  Care coordination call placed to [facility] specialty pharmacy in regard to Trulicity refill.  Spoke to [place] who informs they received the new prescription on 02/24/23, label was created yesterday, and medication is to be delivered today 02/26/23.  Successful outreach to patient. HIPAA verified. Informed patient when to expect her medication and she verbalized understanding.   [name], CPhT [place]  [facility] Office: [phone] Fax: [phone] Email: [email or web address]

## 2023-03-25 ENCOUNTER — Ambulatory Visit: Payer: Medicare HMO | Admitting: Podiatry

## 2023-03-25 DIAGNOSIS — M79675 Pain in left toe(s): Secondary | ICD-10-CM | POA: Diagnosis not present

## 2023-03-25 DIAGNOSIS — Z23 Encounter for immunization: Secondary | ICD-10-CM | POA: Diagnosis not present

## 2023-03-25 DIAGNOSIS — L84 Corns and callosities: Secondary | ICD-10-CM

## 2023-03-25 DIAGNOSIS — M79674 Pain in right toe(s): Secondary | ICD-10-CM

## 2023-03-25 DIAGNOSIS — B351 Tinea unguium: Secondary | ICD-10-CM | POA: Diagnosis not present

## 2023-03-25 DIAGNOSIS — E1151 Type 2 diabetes mellitus with diabetic peripheral angiopathy without gangrene: Secondary | ICD-10-CM | POA: Diagnosis not present

## 2023-03-25 NOTE — Progress Notes (Signed)
Subjective:  Patient ID: Carol Torres, female    DOB: [birth year],  MRN: [id number]  Carol SPONSELLER presents to clinic today for:  Chief Complaint  Patient presents with   Nail Problem    Diabetic Foot Care-nail trim    Callouses    Callus trim bilateral feet.   . Patient notes nails are thick and elongated, causing pain in shoe gear when ambulating.  She has painful calluses left submet 4 and right submet 2.  States that she will be leaving for her first cruise in 2 weeks to the [place]  PCP is Buckner Malta, MD.  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 female in NAD. AAO x 3.  Vascular Examination: Patient has palpable DP pulse, absent PT pulse bilateral.  Delayed capillary refill bilateral toes.  Sparse digital hair bilateral.  Proximal to distal cooling WNL bilateral.    Dermatological Examination: Interspaces are clear with no open lesions noted bilateral.  Skin is shiny and atrophic bilateral.  Nails are 3-78mm thick, with yellowish/brown discoloration, subungual debris and distal onycholysis x10.  There is pain with compression of nails x10.  There are hyperkeratotic lesions noted left submet 4 and right submet 2.  Patient qualifies for at-risk foot care because of diabetes with PVD.  Assessment/Plan: 1. Pain due to onychomycosis of toenails of both feet   2. Type II diabetes mellitus with peripheral circulatory disorder (HCC)   3. Callus of foot    Mycotic nails x10 were sharply debrided with sterile nail  nippers and power debriding burr to decrease bulk and length.  Hyperkeratotic lesions x 2 were shaved with #312 blade.  Return in about 3 months (around 06/24/2023).   [name], DPM, FACFAS [facility]     [address] [place], [place] [address]                Office [phone]  Fax [phone]

## 2023-04-02 DIAGNOSIS — F17200 Nicotine dependence, unspecified, uncomplicated: Secondary | ICD-10-CM | POA: Diagnosis not present

## 2023-04-02 DIAGNOSIS — R079 Chest pain, unspecified: Secondary | ICD-10-CM | POA: Diagnosis not present

## 2023-04-02 DIAGNOSIS — E119 Type 2 diabetes mellitus without complications: Secondary | ICD-10-CM | POA: Diagnosis not present

## 2023-04-02 DIAGNOSIS — J9811 Atelectasis: Secondary | ICD-10-CM | POA: Diagnosis not present

## 2023-04-02 DIAGNOSIS — M940 Chondrocostal junction syndrome [Tietze]: Secondary | ICD-10-CM | POA: Diagnosis not present

## 2023-04-02 DIAGNOSIS — I10 Essential (primary) hypertension: Secondary | ICD-10-CM | POA: Diagnosis not present

## 2023-04-02 DIAGNOSIS — I161 Hypertensive emergency: Secondary | ICD-10-CM | POA: Diagnosis not present

## 2023-04-03 DIAGNOSIS — M940 Chondrocostal junction syndrome [Tietze]: Secondary | ICD-10-CM | POA: Diagnosis not present

## 2023-04-03 DIAGNOSIS — Z6827 Body mass index (BMI) 27.0-27.9, adult: Secondary | ICD-10-CM | POA: Diagnosis not present

## 2023-04-15 ENCOUNTER — Telehealth: Payer: Self-pay | Admitting: Pharmacy Technician

## 2023-04-15 DIAGNOSIS — Z5986 Financial insecurity: Secondary | ICD-10-CM

## 2023-04-15 NOTE — Progress Notes (Signed)
[facility])  [facility] Quality Pharmacy Team   04/15/2023  Carol Torres 11-21-1945 [id number]  Reason for referral: Medication assistance 2025 renewal  Referral source:  Self Current insurance: [employer]  Outreach:  Successful telephone call with patient.  HIPAA identifiers verified. 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 She informs she takes Comoros 10mg  and Trulicity 0.75mg /0.16ml prescribed by Dr. Kathrynn Humble. Patient is unable to sign consent online as patient does not have an email address or mobile phone number. As such, patient will be mailed re enrollment application as outline below.   Medication Assistance Findings:  Medication assistance needs identified: Trulicity and Comoros  Extra Help:  Not eligible for Extra Help Low Income Subsidy based on reported income and assets  Additional medication assistance options reviewed with patient as warranted:  No other options identified  Plan: I will route patient assistance letter to [facility] pharmacy technician who will coordinate patient assistance program application process for medications listed above.  [facility] pharmacy technician will assist with obtaining all required documents from both patient and provider(s) and submit application(s) once completed.  Thank you for allowing [facility] pharmacy to be a part of this patient's care.   [name], CPhT [place]  Office: [phone] Fax: [phone] Email: [email or web address]

## 2023-04-16 ENCOUNTER — Telehealth: Payer: Self-pay | Admitting: Pharmacy Technician

## 2023-04-16 DIAGNOSIS — H401132 Primary open-angle glaucoma, bilateral, moderate stage: Secondary | ICD-10-CM | POA: Diagnosis not present

## 2023-04-16 DIAGNOSIS — H34812 Central retinal vein occlusion, left eye, with macular edema: Secondary | ICD-10-CM | POA: Diagnosis not present

## 2023-04-16 DIAGNOSIS — Z5986 Financial insecurity: Secondary | ICD-10-CM

## 2023-04-16 DIAGNOSIS — H43823 Vitreomacular adhesion, bilateral: Secondary | ICD-10-CM | POA: Diagnosis not present

## 2023-04-16 NOTE — Progress Notes (Signed)
[facility])                                            [facility] Quality Pharmacy Team    04/16/2023  Carol QUIZHPI 05-20-1946 [id number]                                      Medication Assistance Referral  Referral From:  Self referral  Medication/Company: Trulicity / [facility] Patient application portion:  Mailed Provider application portion: Faxed  to Dr. Buckner Malta Provider address/fax verified via: Office website  Medication/Company: Marcelline Deist / [employer] Patient application portion:  Mailed Provider application portion: Faxed  to Dr. Buckner Malta Provider address/fax verified via: Office website  [name], CPhT [place]  Office: [phone] Fax: [phone] Email: [email or web address]

## 2023-06-04 ENCOUNTER — Other Ambulatory Visit: Payer: Self-pay | Admitting: Pharmacy Technician

## 2023-06-04 DIAGNOSIS — Z5986 Financial insecurity: Secondary | ICD-10-CM

## 2023-06-04 NOTE — Progress Notes (Signed)
Pharmacy Medication Assistance Program Note    06/04/2023  Patient ID: Carol Torres, female   DOB: 13-Dec-1945, 77 y.o.   MRN: [id number]     06/04/2023  Outreach Medication One  Initial Outreach Date (Medication One) 04/16/2023  Manufacturer Medication One Retail buyer Drugs Trulicity  Dose of Trulicity 0.75mg /0.39ml  Type of Radiographer, therapeutic Assistance  Date Application Sent to Patient 04/16/2023  Application Items Requested Application;Proof of Income;Other  Date Application Sent to Prescriber 04/16/2023  Name of Prescriber Buckner Malta  Date Application Received From Patient 05/16/2023  Application Items Received From Patient Application;Proof of Income;Other  Date Application Received From Provider 05/22/2023  Date Application Submitted to Manufacturer 06/03/2023  Method Application Sent to Manufacturer Fax         06/04/2023  Outreach Medication Two  Initial Outreach Date (Medication Two) 04/16/2023  Manufacturer Medication Two [employer]  [employer] Drugs Farxiga  Dose of Farxiga 10mg   Type of Radiographer, therapeutic Assistance  Date Application Sent to Patient 04/16/2023  Application Items Requested Application;Proof of Income;Other  Date Application Sent to Prescriber 04/16/2023  Date Application Received From Patient 05/06/2023  Application Items Received From Patient Application;Proof of Income;Other  Date Application Received From Provider 05/22/2023  Method Application Sent to Manufacturer Fax  Date Application Submitted to Manufacturer 06/03/2023       Signature  Kristopher Glee   Office: [phone] Fax: [phone] Email: [email or web address]

## 2023-06-18 DIAGNOSIS — H401132 Primary open-angle glaucoma, bilateral, moderate stage: Secondary | ICD-10-CM | POA: Diagnosis not present

## 2023-06-18 DIAGNOSIS — H43823 Vitreomacular adhesion, bilateral: Secondary | ICD-10-CM | POA: Diagnosis not present

## 2023-06-18 DIAGNOSIS — H34812 Central retinal vein occlusion, left eye, with macular edema: Secondary | ICD-10-CM | POA: Diagnosis not present

## 2023-06-18 DIAGNOSIS — H35033 Hypertensive retinopathy, bilateral: Secondary | ICD-10-CM | POA: Diagnosis not present

## 2023-06-25 ENCOUNTER — Ambulatory Visit: Payer: Medicare HMO | Admitting: Podiatry

## 2023-06-29 ENCOUNTER — Telehealth: Payer: Self-pay | Admitting: Pharmacy Technician

## 2023-06-29 DIAGNOSIS — Z5986 Financial insecurity: Secondary | ICD-10-CM

## 2023-06-29 NOTE — Progress Notes (Signed)
Pharmacy Medication Assistance Program Note    06/29/2023  Patient ID: Carol Torres, female   DOB: 1946/05/19, 77 y.o.   MRN: [id number]     06/04/2023 06/29/2023  Outreach Medication One  Initial Outreach Date (Medication One) 04/16/2023   Manufacturer Medication One [employer] Trulicity   Dose of Trulicity 0.75mg /0.45ml   Type of Radiographer, therapeutic Assistance   Date Application Sent to Patient 04/16/2023   Application Items Requested Application;Proof of Income;Other   Date Application Sent to Prescriber 04/16/2023   Name of Prescriber Buckner Malta   Date Application Received From Patient 05/16/2023   Application Items Received From Patient Application;Proof of Income;Other   Date Application Received From Provider 05/22/2023   Date Application Submitted to Manufacturer 06/03/2023   Method Application Sent to Manufacturer Fax   Patient Assistance Determination  Approved  Approval Start Date  07/01/2023  Approval End Date  06/29/2024   Care coordination call placed to [facility] in regard to Trulicity application. Spoke to April who informs patient is APPROVED 07/01/23-06/29/24. Medication will auto fill and ship to patient's home address. Patient may call [facility] at any time to check on her next shipment by calling [phone]      06/04/2023 06/29/2023  Outreach Medication Two  Initial Outreach Date (Medication Two) 04/16/2023   Manufacturer Medication Two [employer]   [employer] Drugs Farxiga   Dose of Farxiga 10mg    Type of Radiographer, therapeutic Assistance   Date Application Sent to Patient 04/16/2023   Application Items Requested Application;Proof of Income;Other   Date Application Sent to Prescriber 04/16/2023   Date Application Received From Patient 05/06/2023   Application Items Received From Patient Application;Proof of Income;Other   Date Application Received From Provider 05/22/2023   Method Application Sent to Manufacturer Fax   Date Application  Submitted to Manufacturer 06/03/2023   Patient Assistance Determination  Approved  Approval Start Date  07/01/2023   Care coordination call placed to [employer] in regard to [facility] application. Per IVR system, patient is APPROVED 07/01/23-06/29/24.  Medication will auto fill and ship to patient's home address. Patient may call [employer] at any time to check on her next shipment by calling [phone].  [name], CPhT [place]  Office: [phone] Fax: [phone] Email: [email or web address]

## 2023-07-29 ENCOUNTER — Ambulatory Visit: Payer: Medicare HMO | Admitting: Podiatry

## 2023-07-29 DIAGNOSIS — M79674 Pain in right toe(s): Secondary | ICD-10-CM

## 2023-07-29 DIAGNOSIS — B351 Tinea unguium: Secondary | ICD-10-CM

## 2023-07-29 DIAGNOSIS — M79675 Pain in left toe(s): Secondary | ICD-10-CM

## 2023-07-29 DIAGNOSIS — E1151 Type 2 diabetes mellitus with diabetic peripheral angiopathy without gangrene: Secondary | ICD-10-CM

## 2023-07-29 DIAGNOSIS — L84 Corns and callosities: Secondary | ICD-10-CM

## 2023-07-29 NOTE — Progress Notes (Signed)
       Subjective:  Patient ID: Carol Torres, female    DOB: 02-05-1946,  MRN: [id number]  Carol Torres presents to clinic today for:  Chief Complaint  Patient presents with   [facility]    [facility] Last A1c was 6.1 ASA 81    Patient notes nails are thick and elongated, causing pain in shoe gear when ambulating.  She has a painful corn left submet 4.  She notes her heels are rough.   PCP is Buckner Malta, MD.  Last seen around 05/14/23.  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 female in NAD. AAO x 3.  Vascular Examination: Patient has palpable DP pulse, absent PT pulse bilateral.  Delayed capillary refill bilateral toes.  Sparse digital hair bilateral.  Proximal to distal cooling WNL bilateral.    Dermatological Examination: Interspaces are clear with no open lesions noted bilateral.  Skin is shiny and atrophic bilateral.  Nails are 3-56mm thick, with yellowish/brown discoloration, subungual debris and distal onycholysis x10.  There is pain with compression of nails x10.  There are hyperkeratotic lesions noted left submet 4 .  Heels have diffuse hyperkeratosis around the periphery of the heels.  Patient qualifies for at-risk foot care because of diabetes with PVD .  Assessment/Plan: 1. Pain due to onychomycosis of toenails of both feet   2. Callus of foot   3. Type II diabetes mellitus with peripheral circulatory disorder (HCC)    Mycotic nails x10 were sharply debrided with sterile nail nippers and power debriding burr to decrease  bulk and length.  Hyperkeratotic lesion left plantar 4th met head was shaved with #312 blade.  Return in about 3 months (around 10/27/2023) for [facility].   [name], DPM, FACFAS [facility]     [address] [place], [place] [address]                Office [phone]  Fax [phone]

## 2023-08-13 DIAGNOSIS — H34812 Central retinal vein occlusion, left eye, with macular edema: Secondary | ICD-10-CM | POA: Diagnosis not present

## 2023-08-13 DIAGNOSIS — H35033 Hypertensive retinopathy, bilateral: Secondary | ICD-10-CM | POA: Diagnosis not present

## 2023-08-13 DIAGNOSIS — H43823 Vitreomacular adhesion, bilateral: Secondary | ICD-10-CM | POA: Diagnosis not present

## 2023-08-13 DIAGNOSIS — H401132 Primary open-angle glaucoma, bilateral, moderate stage: Secondary | ICD-10-CM | POA: Diagnosis not present

## 2023-09-15 DIAGNOSIS — Z1231 Encounter for screening mammogram for malignant neoplasm of breast: Secondary | ICD-10-CM | POA: Diagnosis not present

## 2023-09-24 DIAGNOSIS — H43823 Vitreomacular adhesion, bilateral: Secondary | ICD-10-CM | POA: Diagnosis not present

## 2023-09-24 DIAGNOSIS — H401132 Primary open-angle glaucoma, bilateral, moderate stage: Secondary | ICD-10-CM | POA: Diagnosis not present

## 2023-09-24 DIAGNOSIS — H34812 Central retinal vein occlusion, left eye, with macular edema: Secondary | ICD-10-CM | POA: Diagnosis not present

## 2023-09-24 DIAGNOSIS — H35033 Hypertensive retinopathy, bilateral: Secondary | ICD-10-CM | POA: Diagnosis not present

## 2023-10-05 ENCOUNTER — Encounter: Payer: Self-pay | Admitting: Pharmacist

## 2023-10-05 NOTE — Progress Notes (Signed)
 Pharmacy Quality Measure Review  This patient is appearing on a report for being at risk of failing the adherence measure for cholesterol (statin), diabetes, and hypertension (ACEi/ARB) medications this calendar year.   Medication: rosuvastatin 10 mg 1 tablet PO twice weekly Last fill date: 07/27/2023 for 90 day supply No refills on file for patient at this time, currently a one time fill. Patient will need a new prescription around 10/25/2023.   Medication: telmisartan 40 mg 1 tablet PO daily Last fill date: 07/21/2023 for 90 day supply, 0 rfs. Will monitor for new prescription to be issued around 10/19/2023.   Medication: metformin 1000 mg 1 tablet PO twice daily Last fill date: 08/26/2023, #90 ay supply. 1 refill   Will collaborate with provider to facilitate refill needs.   Reynold Bowen, PharmD Clinical Pharmacist [place] Direct Dial: [phone]

## 2023-11-05 DIAGNOSIS — H401132 Primary open-angle glaucoma, bilateral, moderate stage: Secondary | ICD-10-CM | POA: Diagnosis not present

## 2023-11-05 DIAGNOSIS — H34812 Central retinal vein occlusion, left eye, with macular edema: Secondary | ICD-10-CM | POA: Diagnosis not present

## 2023-11-05 DIAGNOSIS — H43823 Vitreomacular adhesion, bilateral: Secondary | ICD-10-CM | POA: Diagnosis not present

## 2023-11-05 DIAGNOSIS — H35033 Hypertensive retinopathy, bilateral: Secondary | ICD-10-CM | POA: Diagnosis not present

## 2023-11-24 DIAGNOSIS — J329 Chronic sinusitis, unspecified: Secondary | ICD-10-CM | POA: Diagnosis not present

## 2023-11-25 ENCOUNTER — Ambulatory Visit: Payer: Medicare HMO | Admitting: Podiatry

## 2023-12-31 DIAGNOSIS — H35033 Hypertensive retinopathy, bilateral: Secondary | ICD-10-CM | POA: Diagnosis not present

## 2023-12-31 DIAGNOSIS — H401132 Primary open-angle glaucoma, bilateral, moderate stage: Secondary | ICD-10-CM | POA: Diagnosis not present

## 2023-12-31 DIAGNOSIS — H43823 Vitreomacular adhesion, bilateral: Secondary | ICD-10-CM | POA: Diagnosis not present

## 2023-12-31 DIAGNOSIS — H34812 Central retinal vein occlusion, left eye, with macular edema: Secondary | ICD-10-CM | POA: Diagnosis not present

## 2024-01-29 ENCOUNTER — Ambulatory Visit: Admitting: Podiatry

## 2024-01-29 DIAGNOSIS — E1151 Type 2 diabetes mellitus with diabetic peripheral angiopathy without gangrene: Secondary | ICD-10-CM | POA: Diagnosis not present

## 2024-01-29 DIAGNOSIS — M79674 Pain in right toe(s): Secondary | ICD-10-CM | POA: Diagnosis not present

## 2024-01-29 DIAGNOSIS — M79675 Pain in left toe(s): Secondary | ICD-10-CM

## 2024-01-29 DIAGNOSIS — L84 Corns and callosities: Secondary | ICD-10-CM

## 2024-01-29 DIAGNOSIS — B351 Tinea unguium: Secondary | ICD-10-CM

## 2024-01-29 NOTE — Progress Notes (Signed)
       Subjective:  Patient ID: Carol Torres, female    DOB: 11/23/1945,  MRN: [id number]  Carol Torres presents to clinic today for:  Chief Complaint  Patient presents with   [facility]    [facility] with callous. Last A1c was 6 something in May, goes next week for recheck. Takes asa.   Patient notes nails are thick and elongated, causing pain in shoe gear when ambulating.  She has painful calluses on the ball of both feet.  PCP is Clemmie Nest, MD. 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 female in NAD. AAO x 3.  Vascular Examination: Patient has palpable DP pulse, absent PT pulse bilateral.  Delayed capillary refill bilateral toes.  Sparse digital hair bilateral.  Proximal to distal cooling WNL bilateral.    Dermatological Examination: Interspaces are clear with no open lesions noted bilateral.  Skin is shiny and atrophic bilateral.  Nails are 3-46mm thick, with yellowish/brown discoloration, subungual debris and distal onycholysis x10.  There is pain with compression of nails x10.  There are hyperkeratotic lesions noted left submet 4 and right submet 2.  There is pain on palpation of the lesions.  Patient qualifies for at-risk foot care because of diabetes with PVD.  Assessment/Plan: 1. Pain due to onychomycosis of toenails of both feet   2. Callus of foot   3. Type II diabetes mellitus with peripheral circulatory disorder (HCC)     Mycotic nails x10 were sharply debrided with sterile nail nippers and  power debriding burr to decrease bulk and length.  Hyperkeratotic lesions x 2 were shaved with #312 blade.  Both of the lesions are proximal to the toes.  Return in about 3 months (around 04/30/2024) for [facility].   [name], DPM, FACFAS [facility]     [address] [place], [place] [address]                Office [phone]  Fax [phone]

## 2024-02-02 DIAGNOSIS — E1165 Type 2 diabetes mellitus with hyperglycemia: Secondary | ICD-10-CM | POA: Diagnosis not present

## 2024-02-18 DIAGNOSIS — H34812 Central retinal vein occlusion, left eye, with macular edema: Secondary | ICD-10-CM | POA: Diagnosis not present

## 2024-03-17 ENCOUNTER — Telehealth: Payer: Self-pay

## 2024-03-17 NOTE — Telephone Encounter (Signed)
 2026 Renewal  PAP: Patient assistance application for Farxiga through [employer] ([employer]) has been mailed to pt's home address on file. Provider portion of application will be faxed to provider's office.  PAP: Patient assistance application for Trulicity through [employer] has been mailed to pt's home address on file. Provider portion of application will be faxed to provider's office.  Provider portion of applications will be mailed to Dr. Delon Contes at [facility]

## 2024-03-31 DIAGNOSIS — H34812 Central retinal vein occlusion, left eye, with macular edema: Secondary | ICD-10-CM | POA: Diagnosis not present

## 2024-04-04 DIAGNOSIS — Z23 Encounter for immunization: Secondary | ICD-10-CM | POA: Diagnosis not present

## 2024-04-05 ENCOUNTER — Other Ambulatory Visit (HOSPITAL_COMMUNITY): Payer: Self-pay

## 2024-04-05 NOTE — Telephone Encounter (Signed)
 Received patient portion of applications for Comoros and Trulicity

## 2024-04-28 DIAGNOSIS — H34812 Central retinal vein occlusion, left eye, with macular edema: Secondary | ICD-10-CM | POA: Diagnosis not present

## 2024-05-30 NOTE — Telephone Encounter (Signed)
 PAP: Application for Trulicity has been submitted to [employer], via fax  PAP: Application for Doreen has been submitted to [employer] ([employer]), via fax

## 2024-06-01 ENCOUNTER — Ambulatory Visit: Admitting: Podiatry

## 2024-06-02 ENCOUNTER — Other Ambulatory Visit (HOSPITAL_COMMUNITY): Payer: Self-pay

## 2024-06-02 NOTE — Telephone Encounter (Signed)
 PAP: Patient assistance application for Trulicity has been approved by PAP Companies: [employer] from 06/30/2024 to 06/29/2025. Medication should be delivered to PAP Delivery: Home. For further shipping updates, please contact [employer] at [phone]. Patient ID is: [id number]

## 2024-06-06 NOTE — Telephone Encounter (Signed)
 PAP: Patient assistance application for Farxiga has been approved by PAP Companies: [employer]ME from 06/30/2024 to 06/29/2025. Medication should be delivered to PAP Delivery: Home. For further shipping updates, please contact [employer] ([employer]) at [phone]. Patient ID is: [id number]

## 2024-06-09 DIAGNOSIS — H353131 Nonexudative age-related macular degeneration, bilateral, early dry stage: Secondary | ICD-10-CM | POA: Diagnosis not present

## 2024-06-09 DIAGNOSIS — H35033 Hypertensive retinopathy, bilateral: Secondary | ICD-10-CM | POA: Diagnosis not present

## 2024-06-09 DIAGNOSIS — H34812 Central retinal vein occlusion, left eye, with macular edema: Secondary | ICD-10-CM | POA: Diagnosis not present

## 2024-06-09 DIAGNOSIS — H43823 Vitreomacular adhesion, bilateral: Secondary | ICD-10-CM | POA: Diagnosis not present

## 2024-06-09 DIAGNOSIS — H401132 Primary open-angle glaucoma, bilateral, moderate stage: Secondary | ICD-10-CM | POA: Diagnosis not present

## 2024-06-29 ENCOUNTER — Ambulatory Visit: Admitting: Podiatry

## 2024-06-29 DIAGNOSIS — M79674 Pain in right toe(s): Secondary | ICD-10-CM | POA: Diagnosis not present

## 2024-06-29 DIAGNOSIS — M79675 Pain in left toe(s): Secondary | ICD-10-CM

## 2024-06-29 DIAGNOSIS — B351 Tinea unguium: Secondary | ICD-10-CM

## 2024-06-29 DIAGNOSIS — L84 Corns and callosities: Secondary | ICD-10-CM | POA: Diagnosis not present

## 2024-06-29 DIAGNOSIS — E1151 Type 2 diabetes mellitus with diabetic peripheral angiopathy without gangrene: Secondary | ICD-10-CM | POA: Diagnosis not present

## 2024-06-29 NOTE — Progress Notes (Unsigned)
 Nails x 10.  Right submet 2 and left submet 4 corns.

## 2024-10-26 ENCOUNTER — Ambulatory Visit: Admitting: Podiatry
